# Patient Record
Sex: Female | Born: 1945 | Race: White | Hispanic: No | Marital: Married | Smoking: Former smoker
Health system: Southern US, Community
[De-identification: ages and names within clinical notes are randomized; demographics above are authoritative.]

## PROBLEM LIST (undated history)

## (undated) DIAGNOSIS — K219 Gastro-esophageal reflux disease without esophagitis: Secondary | ICD-10-CM

## (undated) DIAGNOSIS — I10 Essential (primary) hypertension: Secondary | ICD-10-CM

## (undated) HISTORY — PX: TONSILLECTOMY: SUR1361

## (undated) HISTORY — PX: ABDOMINAL HYSTERECTOMY: SHX81

## (undated) HISTORY — PX: CHOLECYSTECTOMY: SHX55

## (undated) HISTORY — PX: APPENDECTOMY: SHX54

## (undated) HISTORY — PX: EYE SURGERY: SHX253

---

## 2004-01-28 ENCOUNTER — Ambulatory Visit: Payer: Self-pay | Admitting: Internal Medicine

## 2004-08-07 ENCOUNTER — Ambulatory Visit: Payer: Self-pay

## 2005-01-22 ENCOUNTER — Ambulatory Visit: Payer: Self-pay | Admitting: Unknown Physician Specialty

## 2007-05-25 ENCOUNTER — Encounter: Payer: Self-pay | Admitting: Orthopaedic Surgery

## 2007-06-05 ENCOUNTER — Ambulatory Visit: Payer: Self-pay | Admitting: Orthopaedic Surgery

## 2007-06-21 ENCOUNTER — Encounter: Payer: Self-pay | Admitting: Orthopaedic Surgery

## 2007-07-05 ENCOUNTER — Ambulatory Visit: Payer: Self-pay | Admitting: Orthopaedic Surgery

## 2007-07-13 ENCOUNTER — Ambulatory Visit: Payer: Self-pay | Admitting: Orthopaedic Surgery

## 2007-07-26 ENCOUNTER — Encounter: Payer: Self-pay | Admitting: Orthopaedic Surgery

## 2007-08-21 ENCOUNTER — Encounter: Payer: Self-pay | Admitting: Orthopaedic Surgery

## 2007-09-20 ENCOUNTER — Encounter: Payer: Self-pay | Admitting: Orthopaedic Surgery

## 2007-10-21 ENCOUNTER — Encounter: Payer: Self-pay | Admitting: Orthopaedic Surgery

## 2007-11-21 ENCOUNTER — Encounter: Payer: Self-pay | Admitting: Orthopaedic Surgery

## 2009-11-25 ENCOUNTER — Ambulatory Visit: Payer: Self-pay | Admitting: Unknown Physician Specialty

## 2010-05-26 ENCOUNTER — Emergency Department: Payer: Self-pay | Admitting: Emergency Medicine

## 2013-07-30 DIAGNOSIS — I10 Essential (primary) hypertension: Secondary | ICD-10-CM | POA: Insufficient documentation

## 2013-07-30 DIAGNOSIS — E785 Hyperlipidemia, unspecified: Secondary | ICD-10-CM | POA: Insufficient documentation

## 2013-07-30 DIAGNOSIS — D649 Anemia, unspecified: Secondary | ICD-10-CM | POA: Insufficient documentation

## 2013-10-17 DIAGNOSIS — N393 Stress incontinence (female) (male): Secondary | ICD-10-CM | POA: Insufficient documentation

## 2014-12-10 ENCOUNTER — Ambulatory Visit: Payer: Medicare Other | Attending: Specialist

## 2014-12-10 DIAGNOSIS — G4761 Periodic limb movement disorder: Secondary | ICD-10-CM | POA: Diagnosis not present

## 2014-12-10 DIAGNOSIS — G4733 Obstructive sleep apnea (adult) (pediatric): Secondary | ICD-10-CM | POA: Diagnosis not present

## 2015-03-06 DIAGNOSIS — Z9841 Cataract extraction status, right eye: Secondary | ICD-10-CM | POA: Insufficient documentation

## 2015-05-28 ENCOUNTER — Other Ambulatory Visit: Payer: Self-pay | Admitting: Internal Medicine

## 2015-05-28 DIAGNOSIS — R1310 Dysphagia, unspecified: Secondary | ICD-10-CM

## 2015-06-05 ENCOUNTER — Ambulatory Visit
Admission: RE | Admit: 2015-06-05 | Discharge: 2015-06-05 | Disposition: A | Discharge status: Home / Self Care | Payer: Medicare HMO | Source: Ambulatory Visit | Attending: Internal Medicine | Admitting: Internal Medicine

## 2015-06-05 DIAGNOSIS — K449 Diaphragmatic hernia without obstruction or gangrene: Secondary | ICD-10-CM | POA: Diagnosis not present

## 2015-06-05 DIAGNOSIS — K219 Gastro-esophageal reflux disease without esophagitis: Secondary | ICD-10-CM | POA: Diagnosis not present

## 2015-06-05 DIAGNOSIS — K224 Dyskinesia of esophagus: Secondary | ICD-10-CM | POA: Diagnosis not present

## 2015-06-05 DIAGNOSIS — R1310 Dysphagia, unspecified: Secondary | ICD-10-CM | POA: Diagnosis present

## 2016-04-05 DIAGNOSIS — R0609 Other forms of dyspnea: Secondary | ICD-10-CM | POA: Insufficient documentation

## 2016-05-20 ENCOUNTER — Other Ambulatory Visit: Payer: Self-pay | Admitting: Internal Medicine

## 2016-05-20 DIAGNOSIS — R1030 Lower abdominal pain, unspecified: Secondary | ICD-10-CM

## 2016-05-31 ENCOUNTER — Ambulatory Visit
Admission: RE | Admit: 2016-05-31 | Discharge: 2016-05-31 | Disposition: A | Discharge status: Home / Self Care | Payer: Medicare HMO | Source: Ambulatory Visit | Attending: Internal Medicine | Admitting: Internal Medicine

## 2016-05-31 DIAGNOSIS — K5732 Diverticulitis of large intestine without perforation or abscess without bleeding: Secondary | ICD-10-CM | POA: Insufficient documentation

## 2016-05-31 DIAGNOSIS — D3502 Benign neoplasm of left adrenal gland: Secondary | ICD-10-CM | POA: Diagnosis not present

## 2016-05-31 DIAGNOSIS — R1030 Lower abdominal pain, unspecified: Secondary | ICD-10-CM | POA: Diagnosis present

## 2016-05-31 HISTORY — DX: Essential (primary) hypertension: I10

## 2016-05-31 LAB — POCT I-STAT CREATININE: Creatinine, Ser: 0.6 mg/dL (ref 0.44–1.00)

## 2016-05-31 MED ORDER — IOPAMIDOL (ISOVUE-300) INJECTION 61%
100.0000 mL | Freq: Once | INTRAVENOUS | Status: AC | PRN
  Administered 2016-05-31: 100 mL via INTRAVENOUS

## 2016-06-09 ENCOUNTER — Ambulatory Visit
Admission: RE | Admit: 2016-06-09 | Discharge: 2016-06-09 | Disposition: A | Discharge status: Home / Self Care | Payer: Medicare HMO | Source: Ambulatory Visit | Attending: Internal Medicine | Admitting: Internal Medicine

## 2016-06-09 ENCOUNTER — Other Ambulatory Visit: Payer: Self-pay | Admitting: Internal Medicine

## 2016-06-09 DIAGNOSIS — Z90710 Acquired absence of both cervix and uterus: Secondary | ICD-10-CM | POA: Insufficient documentation

## 2016-06-09 DIAGNOSIS — Z9049 Acquired absence of other specified parts of digestive tract: Secondary | ICD-10-CM | POA: Insufficient documentation

## 2016-06-09 DIAGNOSIS — R1084 Generalized abdominal pain: Secondary | ICD-10-CM

## 2016-06-09 DIAGNOSIS — K5730 Diverticulosis of large intestine without perforation or abscess without bleeding: Secondary | ICD-10-CM | POA: Insufficient documentation

## 2016-06-09 MED ORDER — IOPAMIDOL (ISOVUE-300) INJECTION 61%
100.0000 mL | Freq: Once | INTRAVENOUS | Status: AC | PRN
  Administered 2016-06-09: 100 mL via INTRAVENOUS

## 2016-09-14 DIAGNOSIS — K2100 Gastro-esophageal reflux disease with esophagitis, without bleeding: Secondary | ICD-10-CM | POA: Insufficient documentation

## 2018-04-12 IMAGING — CT CT ABD-PELV W/ CM
1 of 3 series · 12 of 32 positions shown, 17 images · IV contrast (APPLIED)
Comparison: None.

CLINICAL DATA: Pelvic region pain

EXAM:
CT ABDOMEN AND PELVIS WITH CONTRAST
TECHNIQUE: Multidetector CT imaging of the abdomen and pelvis was performed
using the standard protocol following bolus administration of
intravenous contrast. Oral contrast was also administered.
CONTRAST:  100mL Z21YDV-EMM IOPAMIDOL (Z21YDV-EMM) INJECTION 61%

[Series 2: axial st · axial · 0.69mm/px · z∈[-1038,-638]mm · 12 of 92 slices shown, 17 images]
[im 6/92  soft-tissue]
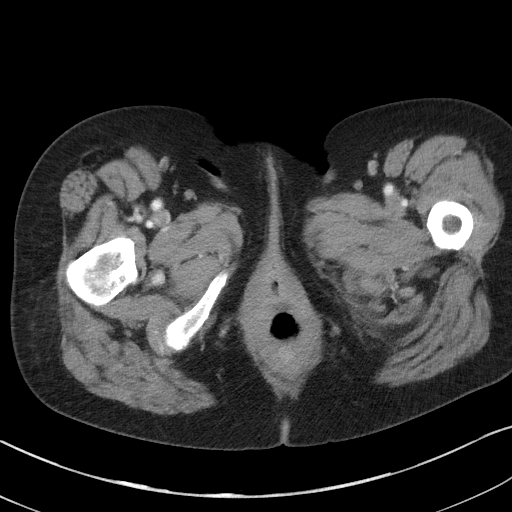
[im 6/92  bone]
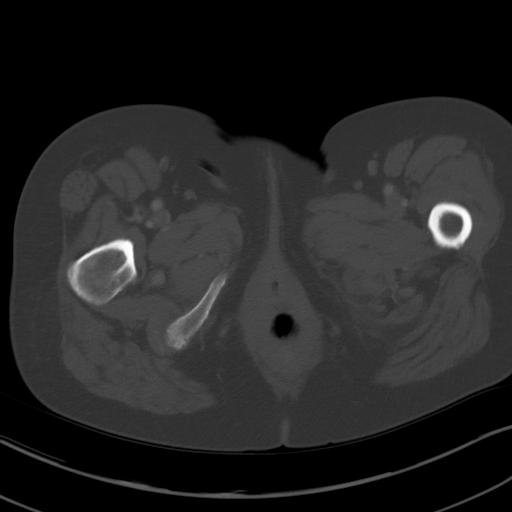
[im 16/92  soft-tissue]
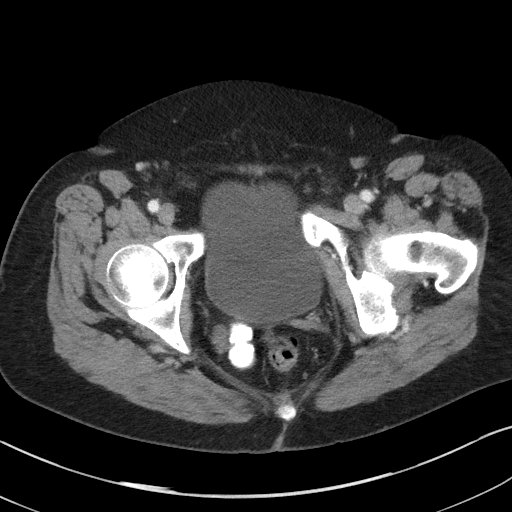
[im 21/92  soft-tissue]
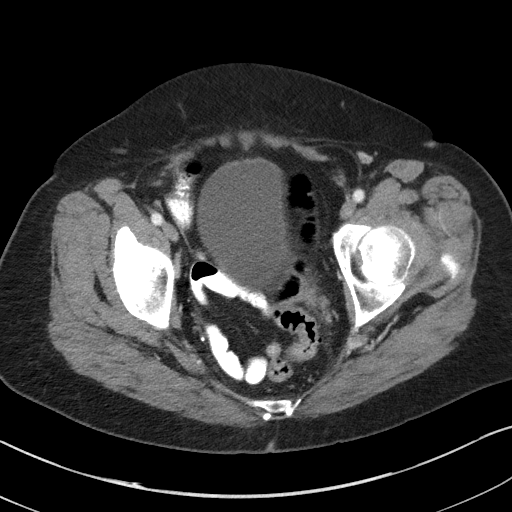
[im 31/92  soft-tissue]
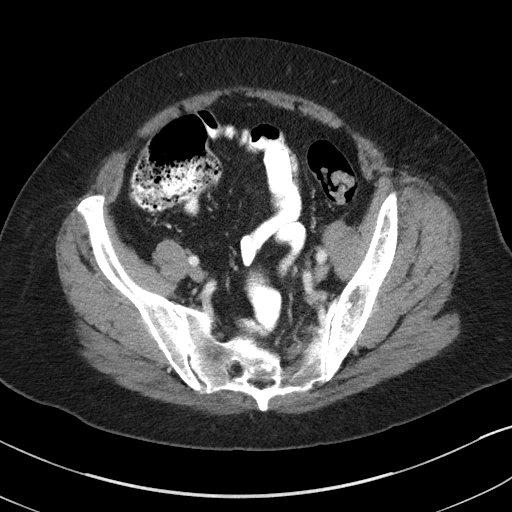
[im 36/92  soft-tissue]
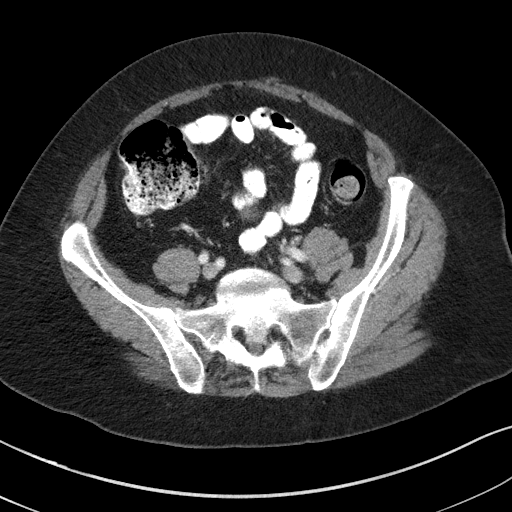
[im 46/92  soft-tissue]
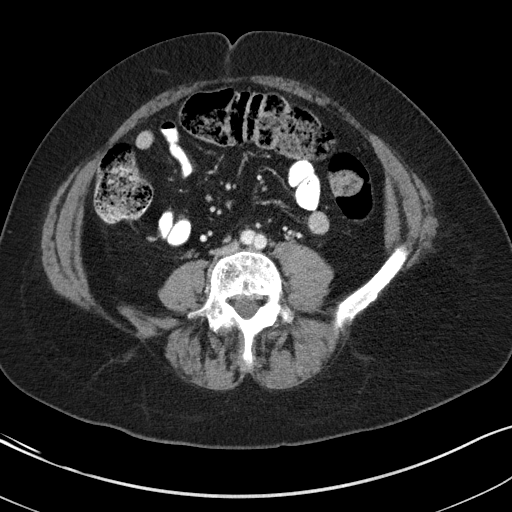
[im 56/92  soft-tissue]
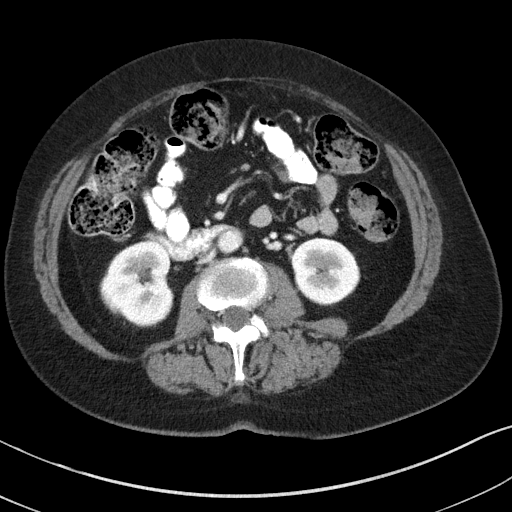
[im 61/92  soft-tissue]
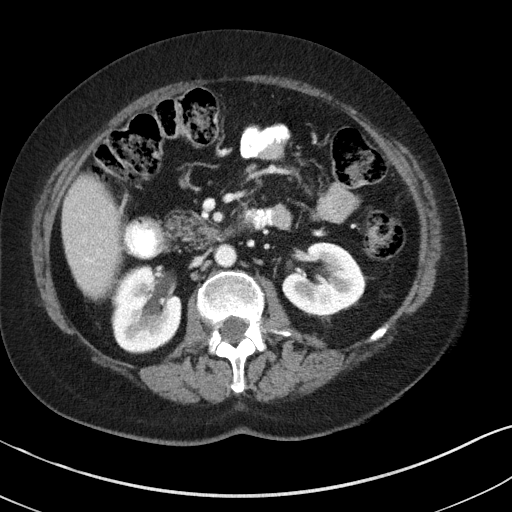
[im 71/92  soft-tissue]
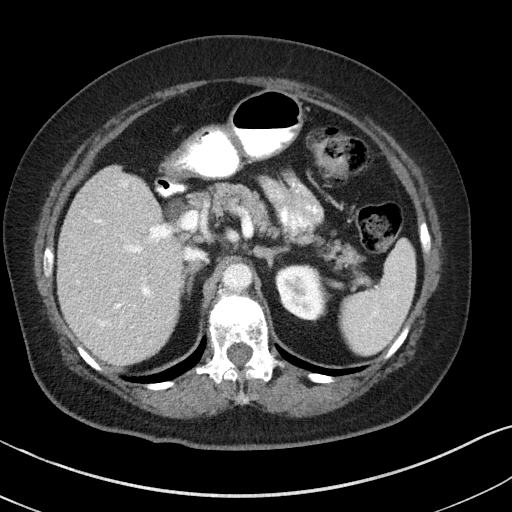
[im 71/92  lung]
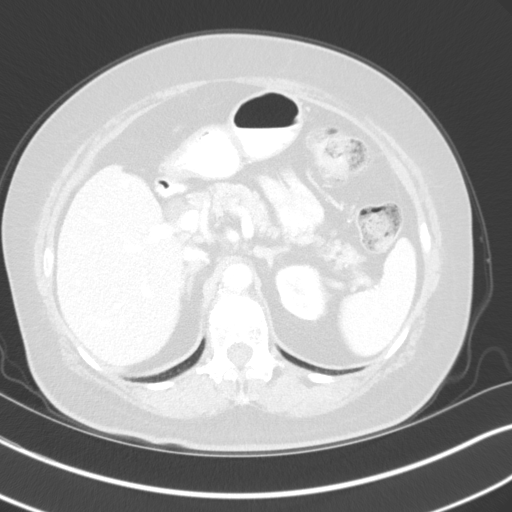
[im 71/92  bone]
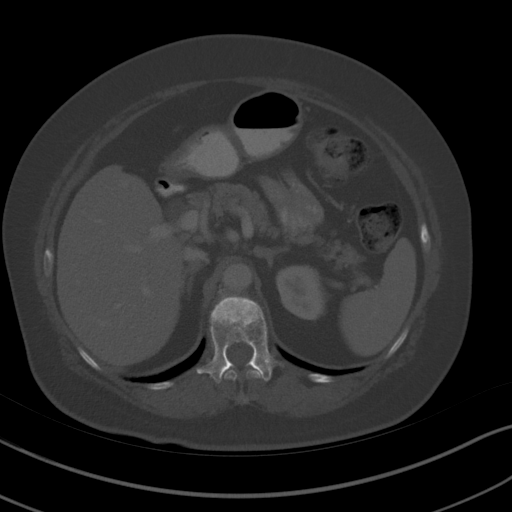
[im 76/92  soft-tissue]
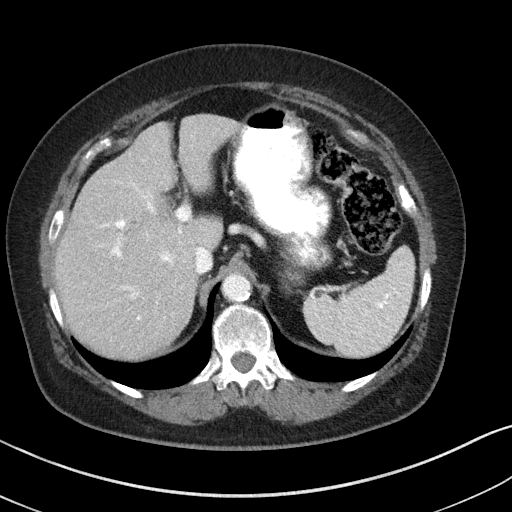
[im 76/92  lung]
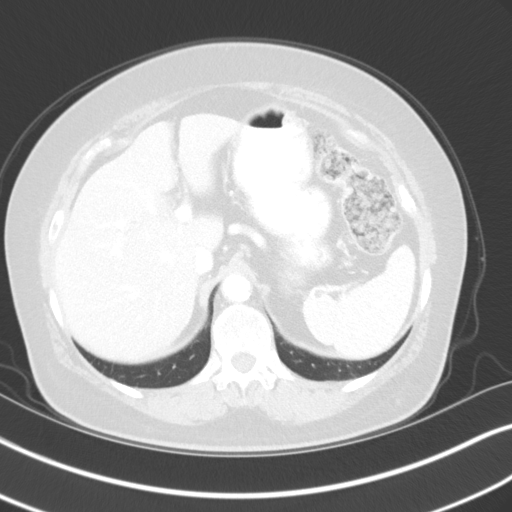
[im 81/92  lung]
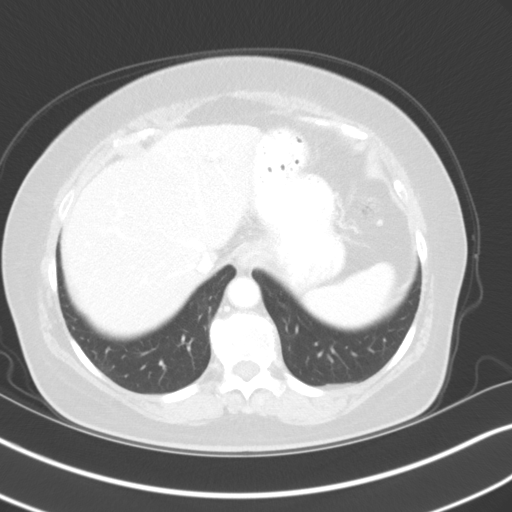
[im 86/92  soft-tissue]
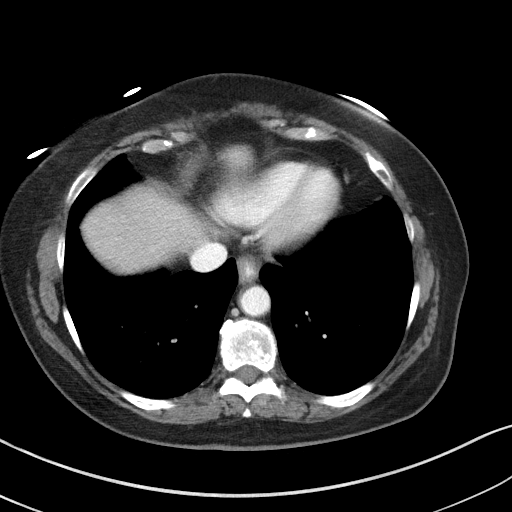
[im 86/92  lung]
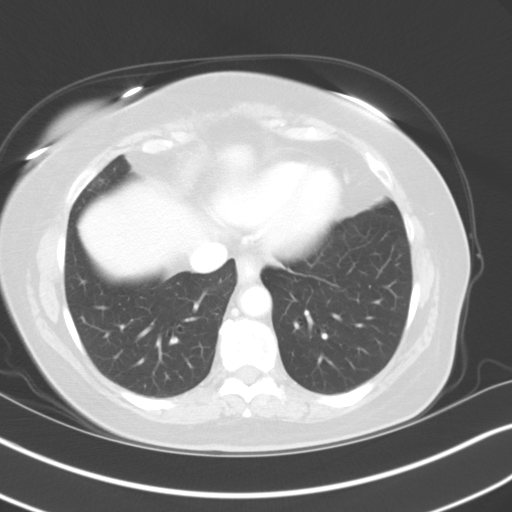

[12 of 32 positions shown; findings below may reference images not displayed]

FINDINGS: Lower chest: Lung bases are clear.

Hepatobiliary: There is hepatic steatosis. There is a 5 mm probable
cyst in the anterior segment of the right lobe of the liver
peripherally. No other focal liver lesion evident. Gallbladder is
absent. There is mild intrahepatic duct dilatation, probably due to
post cholecystectomy state. Common bile duct measures 9 mm, upper
normal for post cholecystectomy state. No biliary duct mass or
calculus evident.

Pancreas: There is mild inflammation at the level of the junction of
the second portion of the duodenum and head of the pancreas. No
other pancreatic inflammation seen. No pancreatic duct dilatation.
No mass evident.

Spleen: No splenic lesion evident.

Adrenals/Urinary Tract: Right adrenal appears normal. There is a
left adrenal apparent adenoma measuring 9 x 8 mm. Kidneys
bilaterally show no evident mass or hydronephrosis on either side.
There is no renal or ureteral calculus on either side. Urinary
bladder is midline with wall thickness within normal limits. There
is mild urinary bladder prolapse.

Stomach/Bowel: There is wall thickening in the distal sigmoid colon
with mild surrounding mesenteric thickening consistent with a degree
of distal sigmoid diverticulitis. There is no abscess or
perforation.

There is mild wall thickening in the second portion of the duodenum.
No fistula.

No other bowel wall thickening. No bowel obstruction. No free air or
portal venous air. There is lipomatous infiltration of the ileocecal
valve.

Vascular/Lymphatic: There is atherosclerotic calcification in the
aorta. There is no appreciable abdominal aortic aneurysm. Aorta is
mildly tortuous. There is no adenopathy in the abdomen or pelvis.

Reproductive: Uterus is absent. No pelvic mass or pelvic fluid
collection. Mild vaginal prolapse as well as urinary bladder
prolapse noted.

Other: There is no periappendiceal region inflammation. No abscess
or ascites in the abdomen or pelvis.

Musculoskeletal: There is degenerative change in the lumbar spine.
There is thoracolumbar dextroscoliosis. There are multiple small
sclerotic foci in the pelvis, all subcentimeter. No lytic or
destructive lesions are evident. No abdominal wall or intramuscular
lesion.
IMPRESSION: Distal sigmoid diverticulitis without abscess or perforation. No
other bowel wall thickening.

There is subtle inflammation in the region of the head of the
pancreas adjacent to the second portion of the duodenum. There is
mild wall thickening in the second portion the duodenum. Question
mild pancreatitis causing inflammation of the proximal duodenum
versus proximal duodenitis causing mild inflammation of the adjacent
pancreas. Appropriate laboratory correlation advised. This finding
may warrant nonemergent direct visualization of the duodenum to
further assess.

Several small sclerotic foci are noted in the pelvis. This
significance of this finding is uncertain. Small bone islands could
present in this manner. Small sclerotic metastases also could
present in this manner. As breast carcinoma is the most likely
neoplastic lesion to present in this manner, correlation with breast
examination and mammography advised to further assess in this
regard.

No bowel obstruction.  No abscess.  No appendiceal inflammation.

No renal or ureteral calculus.  No hydronephrosis.

Small left adrenal adenoma.

These results will be called to the ordering clinician or
representative by the Radiologist Assistant, and communication
documented in the PACS or zVision Dashboard.

## 2018-10-13 DIAGNOSIS — E559 Vitamin D deficiency, unspecified: Secondary | ICD-10-CM | POA: Insufficient documentation

## 2019-05-30 ENCOUNTER — Emergency Department
Admission: EM | Admit: 2019-05-30 | Discharge: 2019-05-30 | Disposition: A | Discharge status: Home / Self Care | Payer: Medicare HMO | Attending: Emergency Medicine | Admitting: Emergency Medicine

## 2019-05-30 ENCOUNTER — Other Ambulatory Visit: Payer: Self-pay

## 2019-05-30 ENCOUNTER — Emergency Department: Payer: Medicare HMO

## 2019-05-30 DIAGNOSIS — I10 Essential (primary) hypertension: Secondary | ICD-10-CM | POA: Diagnosis not present

## 2019-05-30 DIAGNOSIS — R079 Chest pain, unspecified: Secondary | ICD-10-CM | POA: Diagnosis present

## 2019-05-30 DIAGNOSIS — R0789 Other chest pain: Secondary | ICD-10-CM | POA: Insufficient documentation

## 2019-05-30 LAB — COMPREHENSIVE METABOLIC PANEL
ALT: 57 U/L — ABNORMAL HIGH (ref 0–44)
AST: 48 U/L — ABNORMAL HIGH (ref 15–41)
Albumin: 3.9 g/dL (ref 3.5–5.0)
Alkaline Phosphatase: 61 U/L (ref 38–126)
Anion gap: 8 (ref 5–15)
BUN: 14 mg/dL (ref 8–23)
CO2: 29 mmol/L (ref 22–32)
Calcium: 9.1 mg/dL (ref 8.9–10.3)
Chloride: 101 mmol/L (ref 98–111)
Creatinine, Ser: 0.65 mg/dL (ref 0.44–1.00)
GFR calc Af Amer: >60 mL/min (ref >60)
GFR calc non Af Amer: >60 mL/min (ref >60)
Glucose, Bld: 104 mg/dL — ABNORMAL HIGH (ref 70–99)
Potassium: 3.7 mmol/L (ref 3.5–5.1)
Sodium: 138 mmol/L (ref 135–145)
Total Bilirubin: 0.9 mg/dL (ref 0.3–1.2)
Total Protein: 7.2 g/dL (ref 6.5–8.1)

## 2019-05-30 LAB — CBC
HCT: 41.1 % (ref 36.0–46.0)
Hemoglobin: 13.9 g/dL (ref 12.0–15.0)
MCH: 29.6 pg (ref 26.0–34.0)
MCHC: 33.8 g/dL (ref 30.0–36.0)
MCV: 87.6 fL (ref 80.0–100.0)
Platelets: 193 10*3/uL (ref 150–400)
RBC: 4.69 MIL/uL (ref 3.87–5.11)
RDW: 12.1 % (ref 11.5–15.5)
WBC: 4 10*3/uL (ref 4.0–10.5)
nRBC: 0 % (ref 0.0–0.2)

## 2019-05-30 LAB — LIPASE, BLOOD: Lipase: 26 U/L (ref 11–51)

## 2019-05-30 LAB — TROPONIN I (HIGH SENSITIVITY)
Troponin I (High Sensitivity): 4 ng/L (ref <18)
Troponin I (High Sensitivity): 3 ng/L (ref <18)

## 2019-05-30 NOTE — ED Triage Notes (Signed)
Pt in with co chest pain since yesterday, states to upper chest radiates to upper back. Describes it as tightening, states feels like when she had gallstones but has had gallbladder removed. Pt denies any shob, diaphoresis, or cold symptoms.

## 2019-05-30 NOTE — ED Notes (Addendum)
Pt states having pain in her chest and around her sternum earlier today, but that it has relieved at this point. Pt denies shob, nausea, fevers, or other symptoms at this time. Pt describes pain as being similar to previous issues with indigestion/gall bladder problems.

## 2019-05-30 NOTE — ED Provider Notes (Signed)
[facility] Emergency Department Provider Note   ____________________________________________    I have reviewed the triage vital signs and the nursing notes.   HISTORY  Chief Complaint Chest Pain     HPI MEE CHERNEY is a 74 y.o. female who presents with complaints of cramping in her chest and back.  Patient reports this occurred around 4:30 PM.  She notes that she had the second coronavirus vaccine on Monday, felt fatigued ill and had body aches yesterday with chills.  Today was mostly feeling better but had cramping in her mid to upper back.  No shortness of breath.  No cough.  Is feeling improved now and symptoms have resolved.  Did not take anything for this.  Does have a history of hypertension.  No history of CAD  Past Medical History:  Diagnosis Date  . Hypertension     There are no problems to display for this patient.     Prior to Admission medications   Not on File     Allergies Sulfa antibiotics  No family history on file.  Social History Patient does not drink, does not smoke Review of Systems  Constitutional: No fever/chills Eyes: No visual changes.  ENT: No sore throat. Cardiovascular: As above Respiratory: Denies shortness of breath. Gastrointestinal: No abdominal pain.  No nausea, no vomiting.   Genitourinary: Negative for dysuria. Musculoskeletal: As above Skin: Negative for rash. Neurological: Negative for headaches or weakness   ____________________________________________   PHYSICAL EXAM:  VITAL SIGNS: ED Triage Vitals  Enc Vitals Group     BP 05/30/19 1923 138/62     Pulse Rate 05/30/19 1923 92     Resp 05/30/19 1923 20     Temp 05/30/19 1923 98.8 F (37.1 C)     Temp Source 05/30/19 1923 Oral     SpO2 05/30/19 1923 97 %     Weight 05/30/19 1924 72.6 kg (160 lb)     Height 05/30/19 1924 1.575 m (5\' 2" )     Head Circumference --      Peak Flow --      Pain Score 05/30/19 1924 8     Pain  Loc --      Pain Edu? --      Excl. in [place]? --     Constitutional: Alert and oriented. No acute distress. Pleasant and interactive  Nose: No congestion/rhinnorhea. Mouth/Throat: Mucous membranes are moist.    Cardiovascular: Normal rate, regular rhythm.  No chest wall tenderness palpation, good peripheral circulation. Respiratory: Normal respiratory effort.  No retractions. Lungs CTAB. Gastrointestinal: Soft and nontender. No distention.    Musculoskeletal: No rash, no vertebral tenderness palpation.  Warm and well perfused Neurologic:  Normal speech and language. No gross focal neurologic deficits are appreciated.  Skin:  Skin is warm, dry and intact. No rash noted. Psychiatric: Mood and affect are normal. Speech and behavior are normal.  ____________________________________________   LABS (all labs ordered are listed, but only abnormal results are displayed)  Labs Reviewed  COMPREHENSIVE METABOLIC PANEL - Abnormal; Notable for the following components:      Result Value   Glucose, Bld 104 (*)    AST 48 (*)    ALT 57 (*)    All other components within normal limits  CBC  LIPASE, BLOOD  TROPONIN I (HIGH SENSITIVITY)  TROPONIN I (HIGH SENSITIVITY)   ____________________________________________  EKG  ED ECG REPORT I, Lavonia Drafts, the attending physician, personally viewed and interpreted this ECG.  Date: 05/30/2019  Rhythm: normal sinus rhythm QRS Axis: normal Intervals: normal ST/T Wave abnormalities: normal Narrative Interpretation: no evidence of acute ischemia  ____________________________________________  RADIOLOGY  Chest x-ray viewed by me, clear lungs, no pneumonia, no pneumothorax ____________________________________________   PROCEDURES  Procedure(s) performed: No  Procedures   Critical Care performed: No ____________________________________________   INITIAL IMPRESSION / ASSESSMENT AND PLAN / ED COURSE  Pertinent labs & imaging results  that were available during my care of the patient were reviewed by me and considered in my medical decision making (see chart for details).  Patient presents with cramping primarily in her mid to upper back, resolved while in the waiting room.  Vital signs here are normal.  No shortness of breath.  No anterior chest pain.  No pleurisy.  Not consistent with PE or dissection.  EKG is quite reassuring, initial troponin is normal making this unlikely to be ACS.  Will check second troponin if normal anticipate discharge with outpatient follow-up.  Second troponin normal.  CBC and CMP are overall quite reassuring,    ____________________________________________   FINAL CLINICAL IMPRESSION(S) / ED DIAGNOSES  Final diagnoses:  Atypical chest pain        Note:  This document was prepared using Dragon voice recognition software and may include unintentional dictation errors.   Lavonia Drafts, MD 05/30/19 2241

## 2021-04-10 IMAGING — CR DG CHEST 2V
1 series · 2 of 2 positions shown · non-contrast
Comparison: 05/26/2010

CLINICAL DATA: Chest pain for 2 days, initial encounter

EXAM:
CHEST - 2 VIEW

[Series 1: dg chest 2 view · 0.14mm/px · 2 of 2 slices shown]
[im 1/2]
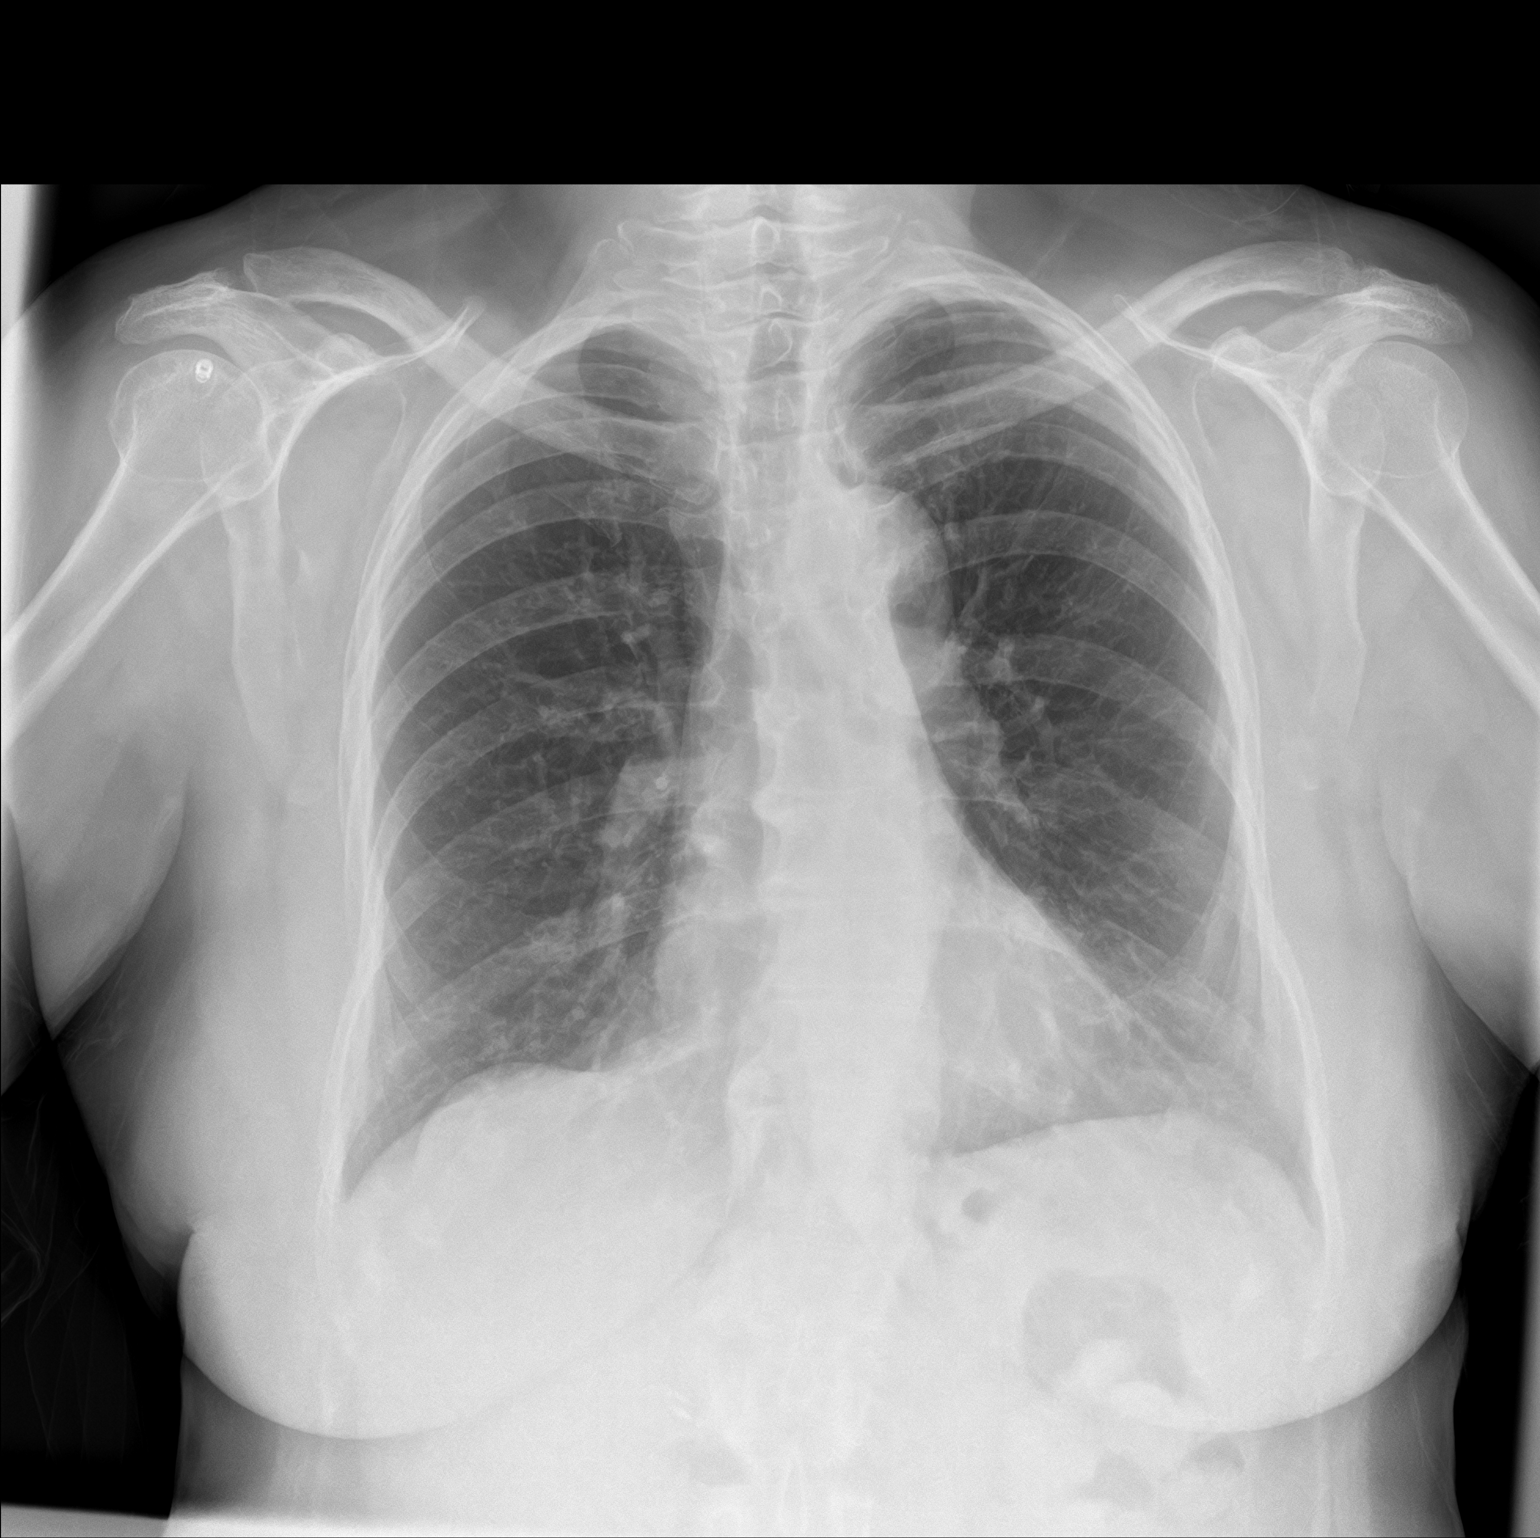
[im 2/2]
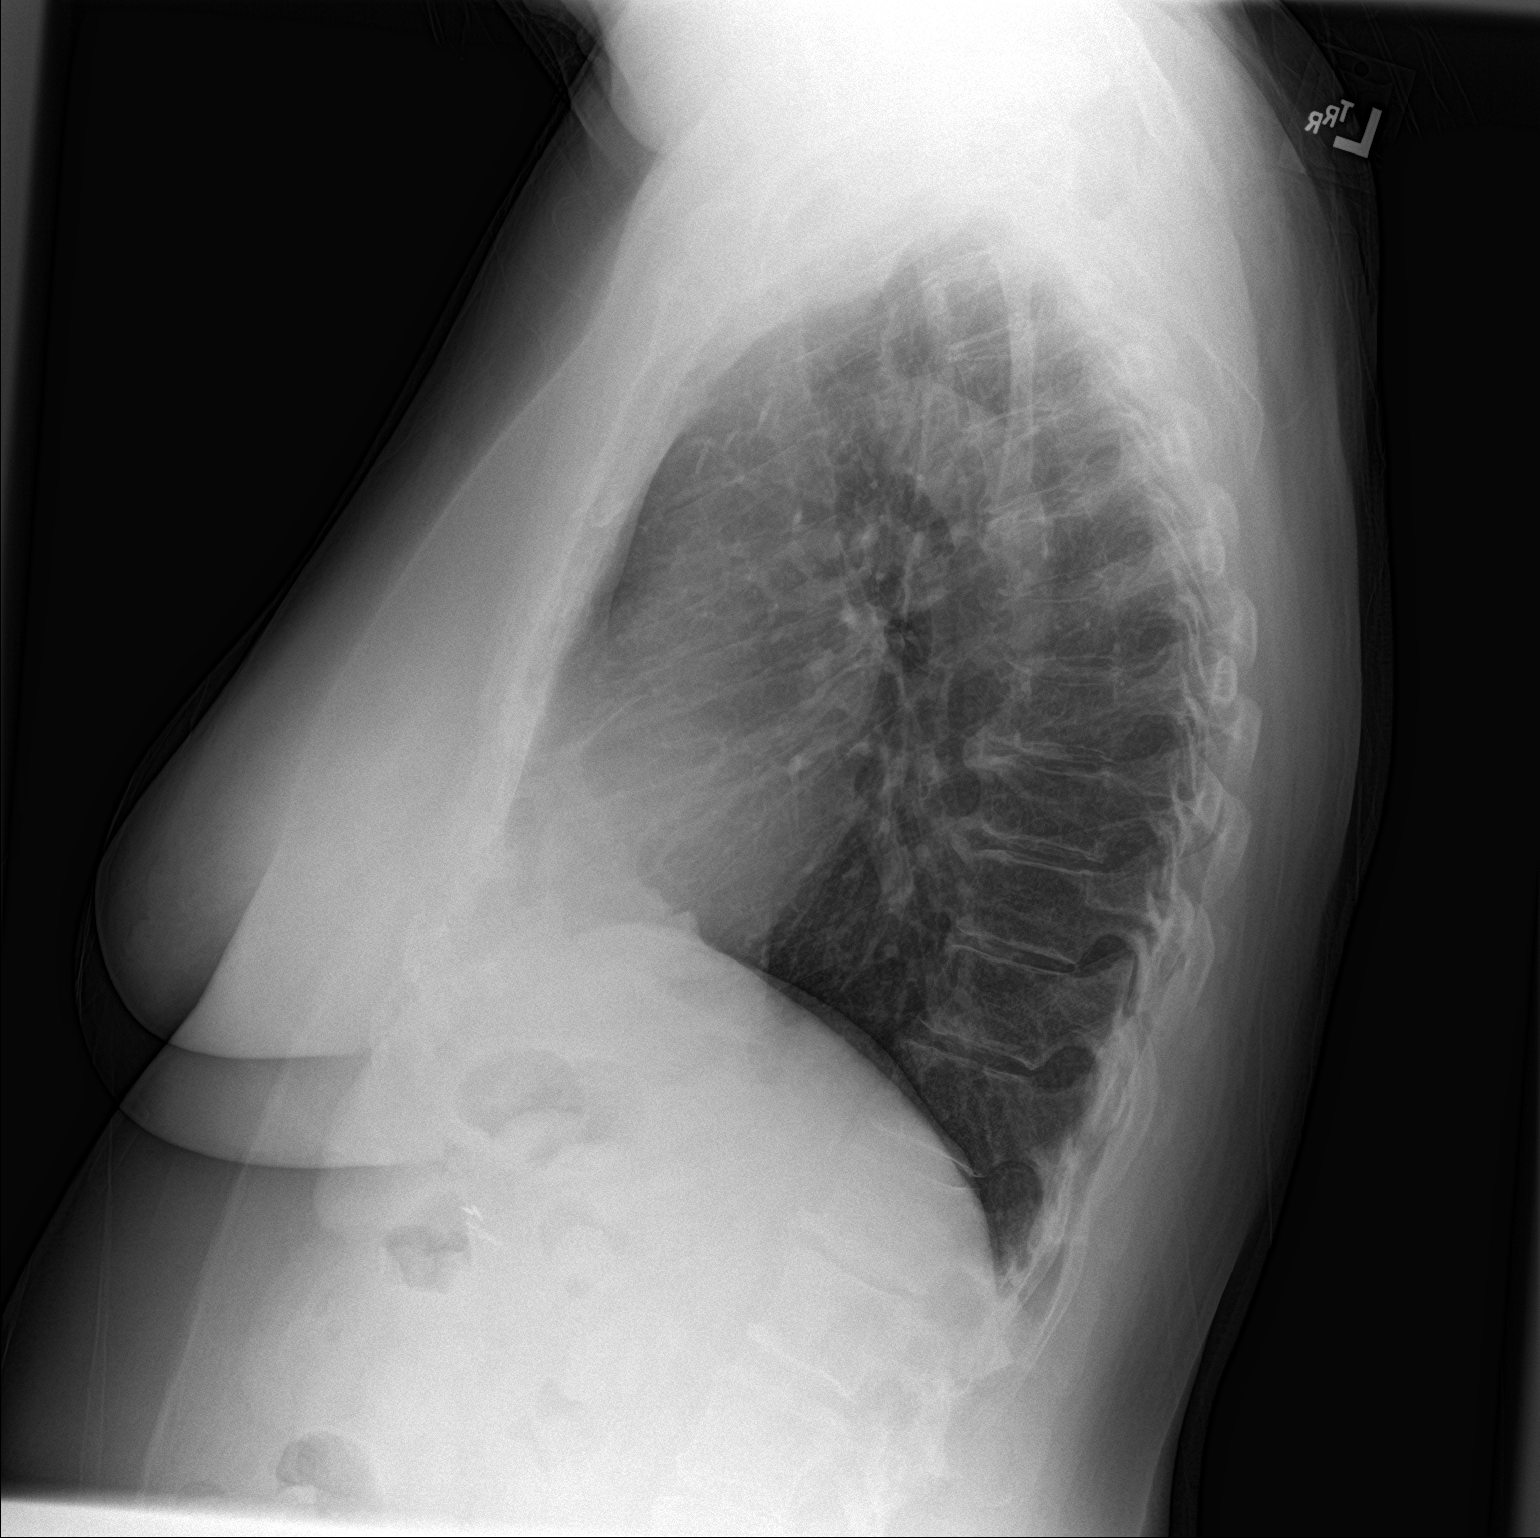

[2 of 2 positions shown; findings below may reference images not displayed]

FINDINGS: Cardiac shadow is within normal limits. Aortic calcifications are
seen. The lungs are well aerated bilaterally. No focal infiltrate or
sizable effusion is seen. No bony abnormality is noted.
IMPRESSION: No active cardiopulmonary disease.

## 2021-05-20 ENCOUNTER — Inpatient Hospital Stay
Admission: RE | Admit: 2021-05-20 | Discharge: 2021-05-20 | Disposition: A | Discharge status: Home / Self Care | Payer: Self-pay | Source: Ambulatory Visit | Attending: *Deleted | Admitting: *Deleted

## 2021-05-20 ENCOUNTER — Other Ambulatory Visit: Payer: Self-pay | Admitting: *Deleted

## 2021-05-20 DIAGNOSIS — Z1231 Encounter for screening mammogram for malignant neoplasm of breast: Secondary | ICD-10-CM

## 2021-05-22 ENCOUNTER — Other Ambulatory Visit: Payer: Self-pay | Admitting: Internal Medicine

## 2021-05-22 DIAGNOSIS — N6321 Unspecified lump in the left breast, upper outer quadrant: Secondary | ICD-10-CM

## 2021-06-09 NOTE — Addendum Note (Signed)
Encounter addended by: Solon Augusta on: 06/09/2021 1:22 PM ? Actions taken: Imaging Exam ended

## 2021-06-09 NOTE — Addendum Note (Signed)
Encounter addended by: Solon Augusta on: 06/09/2021 1:23 PM ? Actions taken: Imaging Exam ended

## 2021-06-09 NOTE — Addendum Note (Signed)
Encounter addended by: Solon Augusta on: 06/09/2021 1:24 PM ? Actions taken: Imaging Exam ended

## 2022-02-10 ENCOUNTER — Other Ambulatory Visit: Payer: Self-pay

## 2022-02-10 DIAGNOSIS — Z780 Asymptomatic menopausal state: Secondary | ICD-10-CM | POA: Insufficient documentation

## 2022-02-10 DIAGNOSIS — M5136 Other intervertebral disc degeneration, lumbar region: Secondary | ICD-10-CM | POA: Insufficient documentation

## 2022-02-10 DIAGNOSIS — M51369 Other intervertebral disc degeneration, lumbar region without mention of lumbar back pain or lower extremity pain: Secondary | ICD-10-CM | POA: Insufficient documentation

## 2022-02-10 DIAGNOSIS — K269 Duodenal ulcer, unspecified as acute or chronic, without hemorrhage or perforation: Secondary | ICD-10-CM | POA: Insufficient documentation

## 2022-02-15 ENCOUNTER — Ambulatory Visit: Payer: Medicare HMO | Admitting: Gastroenterology

## 2022-02-15 VITALS — BP 135/81 | HR 82 | Temp 98.1°F | Ht 62.5 in | Wt 171.8 lb

## 2022-02-15 DIAGNOSIS — K219 Gastro-esophageal reflux disease without esophagitis: Secondary | ICD-10-CM | POA: Diagnosis not present

## 2022-02-15 DIAGNOSIS — R152 Fecal urgency: Secondary | ICD-10-CM

## 2022-02-15 DIAGNOSIS — R159 Full incontinence of feces: Secondary | ICD-10-CM | POA: Diagnosis not present

## 2022-02-15 MED ORDER — OMEPRAZOLE 20 MG PO CPDR: 20.0000 mg | DELAYED_RELEASE_CAPSULE | Freq: Two times a day (BID) | ORAL | 0 refills | Status: DC

## 2022-02-15 NOTE — Progress Notes (Signed)
Jonathon Bellows MD, MRCP(U.K) [address]  [place]  [place], [place] [address]  Main: [phone]  Fax: [phone]   Gastroenterology Consultation  Referring Provider:     Idelle Crouch, MD Primary Care Physician:  Idelle Crouch, MD Primary Gastroenterologist:  Dr. Jonathon Bellows  Reason for Consultation:   Indigestion        HPI:   Gina Arnold is a 76 y.o. y/o female referred for consultation & management  by Dr. Doy Hutching, Leonie Douglas, MD.     She says she is here today to see me for symptoms of indigestion.  She says that has been going on for a few months.  She said that her symptoms are predominantly heartburn which she wakes up in the morning no issues with swallowing she has a dinner an hour or 2 before bedtime but lays flat right after her dinner not on any PPI.  She has gained weight recently.  No other complaints.  Denies any abdominal pain. She also complains of recurrent UTIs and urinary and bladder incontinence going on for a while.  She recollects she was in prolonged labor during childbirth.  Her last colonoscopy was just a few years back and states she is not due for one yet. Past Medical History:  Diagnosis Date   Hypertension       Prior to Admission medications   Medication Sig Start Date End Date Taking? Authorizing Provider  amLODipine (NORVASC) 5 MG tablet Take 5 mg by mouth daily.   Yes [provider]  b complex vitamins capsule Take 1 capsule by mouth daily.   Yes [provider]  Calcium Carbonate 500 MG CHEW Chew by mouth.   Yes [provider]  famotidine (PEPCID) 40 MG tablet Take 40 mg by mouth daily.   Yes [provider]  magnesium oxide (MAG-OX) 400 MG tablet Take 400 mg by mouth daily.   Yes [provider]  melatonin 1 MG TABS tablet Take 1 mg by mouth at bedtime as needed.   Yes [provider]  oxybutynin (DITROPAN) 5 MG tablet Take 5 mg by mouth 3 (three) times daily. 08/27/20   Yes [provider]  pantoprazole (PROTONIX) 40 MG tablet Take 40 mg by mouth 2 (two) times daily. 08/04/21 08/04/22 Yes [provider]  phentermine (ADIPEX-P) 37.5 MG tablet Take 37.5 mg by mouth daily before breakfast. 09/23/21  Yes [provider]  simvastatin (ZOCOR) 20 MG tablet Take 1 tablet by mouth at bedtime. 03/24/21  Yes [provider]  UNABLE TO FIND Take by mouth.   Yes [provider]    No family history on file.      Allergies as of 02/15/2022 - Review Complete 02/15/2022  Allergen Reaction Noted   Hydrocodone-acetaminophen Itching and Other (See Comments) 10/16/2013   Sulfasalazine Nausea And Vomiting 05/31/2016   Ciprofloxacin Nausea And Vomiting and Nausea Only 08/02/2016   Codeine Itching 11/10/2013   Metronidazole Nausea And Vomiting and Nausea Only 08/02/2016   Sulfa antibiotics Other (See Comments) 02/17/2015   Tramadol Nausea And Vomiting 02/17/2015    Review of Systems:    All systems reviewed and negative except where noted in HPI.   Physical Exam:  BP 135/81   Pulse 82   Temp 98.1 F (36.7 C) (Oral)   Ht 5' 2.5" (1.588 m)   Wt 171 lb 12.8 oz (77.9 kg)   BMI 30.92 kg/m  No LMP recorded. Patient is postmenopausal.  Psych:  Alert and cooperative. Normal mood and affect. General:   Alert,  Well-developed, well-nourished, pleasant and cooperative in NAD Head:  Normocephalic and atraumatic. Eyes:  Sclera clear, no icterus.   Conjunctiva pink. Ears:  Normal auditory acuity. Neurologic:  Alert and oriented x3;  grossly normal neurologically. Psych:  Alert and cooperative. Normal mood and affect.  Imaging Studies: No results found.  Assessment and Plan:   Gina Arnold is a 76 y.o. y/o female has been referred for complaints of indigestion but her symptoms are very suggestive of nocturnal reflux.  Not yet attempted a trial of PPI she also had symptoms of fecal and urinary incontinence likely due to bladder  prolapse based on her history.  Plan 1.  Commence on omeprazole 20 mg twice daily at next visit if no better will proceed with EGD  2.  Bowel incontinence likely related to bladder prolapse suggested a colonoscopy but she would like to wait till her next visit and then if we are performing an EGD we will do the colonoscopy at the same time.  3.  Patient information provided about GERD picture of a wedge pillow provided discussed about lifestyle changes and need to lose some weight  Follow up in 8 weeks  Dr Jonathon Bellows MD,MRCP(U.K)

## 2022-02-15 NOTE — Patient Instructions (Addendum)
Please purchase a wedge pillow from [employer].  Food Choices for Gastroesophageal Reflux Disease, Adult When you have gastroesophageal reflux disease (GERD), the foods you eat and your eating habits are very important. Choosing the right foods can help ease your discomfort. Think about working with a food expert (dietitian) to help you make good choices. What are tips for following this plan? Reading food labels Look for foods that are low in saturated fat. Foods that may help with your symptoms include: Foods that have less than 5% of daily value (DV) of fat. Foods that have 0 grams of trans fat. Cooking Do not fry your food. Cook your food by baking, steaming, grilling, or broiling. These are all methods that do not need a lot of fat for cooking. To add flavor, try to use herbs that are low in spice and acidity. Meal planning  Choose healthy foods that are low in fat, such as: Fruits and vegetables. Whole grains. Low-fat dairy products. Lean meats, fish, and poultry. Eat small meals often instead of eating 3 large meals each day. Eat your meals slowly in a place where you are relaxed. Avoid bending over or lying down until 2-3 hours after eating. Limit high-fat foods such as fatty meats or fried foods. Limit your intake of fatty foods, such as oils, butter, and shortening. Avoid the following as told by your doctor: Foods that cause symptoms. These may be different for different people. Keep a food diary to keep track of foods that cause symptoms. Alcohol. Drinking a lot of liquid with meals. Eating meals during the 2-3 hours before bed. Lifestyle Stay at a healthy weight. Ask your doctor what weight is healthy for you. If you need to lose weight, work with your doctor to do so safely. Exercise for at least 30 minutes on 5 or more days each week, or as told by your doctor. Wear loose-fitting clothes. Do not smoke or use any products that contain nicotine or tobacco. If you need help  quitting, ask your doctor. Sleep with the head of your bed higher than your feet. Use a wedge under the mattress or blocks under the bed frame to raise the head of the bed. Chew sugar-free gum after meals. What foods should eat?  Eat a healthy, well-balanced diet of fruits, vegetables, whole grains, low-fat dairy products, lean meats, fish, and poultry. Each person is different. Foods that may cause symptoms in one person may not cause any symptoms in another person. Work with your doctor to find foods that are safe for you. The items listed above may not be a complete list of what you can eat and drink. Contact a food expert for more options. What foods should I avoid? Limiting some of these foods may help in managing the symptoms of GERD. Everyone is different. Talk with a food expert or your doctor to help you find the exact foods to avoid, if any. Fruits Any fruits prepared with added fat. Any fruits that cause symptoms. For some people, this may include citrus fruits, such as oranges, grapefruit, pineapple, and lemons. Vegetables Deep-fried vegetables. [place] fries. Any vegetables prepared with added fat. Any vegetables that cause symptoms. For some people, this may include tomatoes and tomato products, chili peppers, onions and garlic, and horseradish. Grains Pastries or quick breads with added fat. Meats and other proteins High-fat meats, such as fatty beef or pork, hot dogs, ribs, ham, sausage, salami, and bacon. Fried meat or protein, including fried fish and fried chicken. Nuts  and nut butters, in large amounts. Dairy Whole milk and chocolate milk. Sour cream. Cream. Ice cream. Cream cheese. Milkshakes. Fats and oils Butter. Margarine. Shortening. Ghee. Beverages Coffee and tea, with or without caffeine. Carbonated beverages. Sodas. Energy drinks. Fruit juice made with acidic fruits, such as orange or grapefruit. Tomato juice. Alcoholic drinks. Sweets and desserts Chocolate and  cocoa. Donuts. Seasonings and condiments Pepper. Peppermint and spearmint. Added salt. Any condiments, herbs, or seasonings that cause symptoms. For some people, this may include curry, hot sauce, or vinegar-based salad dressings. The items listed above may not be a complete list of what you should not eat and drink. Contact a food expert for more options. Questions to ask your doctor Diet and lifestyle changes are often the first steps that are taken to manage symptoms of GERD. If diet and lifestyle changes do not help, talk with your doctor about taking medicines. Where to find more information International [employer] for Gastrointestinal Disorders: aboutgerd.org Summary When you have GERD, food and lifestyle choices are very important in easing your symptoms. Eat small meals often instead of 3 large meals a day. Eat your meals slowly and in a place where you are relaxed. Avoid bending over or lying down until 2-3 hours after eating. Limit high-fat foods such as fatty meats or fried foods. This information is not intended to replace advice given to you by your health care provider. Make sure you discuss any questions you have with your health care provider. Document Revised: 09/17/2019 Document Reviewed: 09/17/2019 Elsevier Patient Education  [employer].

## 2022-03-05 ENCOUNTER — Ambulatory Visit: Payer: Medicare HMO | Admitting: Urology

## 2022-03-05 VITALS — BP 168/71 | HR 93 | Ht 62.0 in | Wt 170.0 lb

## 2022-03-05 DIAGNOSIS — Z87440 Personal history of urinary (tract) infections: Secondary | ICD-10-CM

## 2022-03-05 DIAGNOSIS — R399 Unspecified symptoms and signs involving the genitourinary system: Secondary | ICD-10-CM

## 2022-03-05 DIAGNOSIS — N390 Urinary tract infection, site not specified: Secondary | ICD-10-CM

## 2022-03-05 LAB — URINALYSIS, COMPLETE
Bilirubin, UA: NEGATIVE
Glucose, UA: NEGATIVE
Ketones, UA: NEGATIVE
Nitrite, UA: NEGATIVE
Protein,UA: NEGATIVE
RBC, UA: NEGATIVE
Specific Gravity, UA: 1.02 (ref 1.005–1.030)
Urobilinogen, Ur: 0.2 mg/dL (ref 0.2–1.0)
pH, UA: 6 (ref 5.0–7.5)

## 2022-03-05 LAB — MICROSCOPIC EXAMINATION

## 2022-03-05 LAB — BLADDER SCAN AMB NON-IMAGING: Scan Result: 0

## 2022-03-05 MED ORDER — ESTRADIOL 0.1 MG/GM VA CREA: TOPICAL_CREAM | VAGINAL | 0 refills | Status: DC

## 2022-03-05 MED ORDER — TRIMETHOPRIM 100 MG PO TABS: 100.0000 mg | ORAL_TABLET | Freq: Every day | ORAL | 2 refills | Status: DC

## 2022-03-05 NOTE — Progress Notes (Unsigned)
03/05/2022 10:47 AM   Gina Arnold 1945-07-26 [id number]  Referring provider: Idelle Crouch, MD [facility] [place],  [place] [address]  Chief Complaint  Patient presents with   Other    HPI: Gina Arnold is a 76 y.o. female.  For evaluation of her recurrent UTIs.  She estimates that she has been treated for 3-4 UTIs over the last year Chart review remarkable for positive urine cultures for Citrobacter and E. coli on 2 occasions Her typical symptoms are low back pain, suprapubic pressure, frequency, urgency, malodorous urine and occasional dysuria No febrile UTIs or pyelonephritis Symptoms resolve after antibiotic therapy then recur She is not sexually active Has been on IR oxybutynin for several years for overactive bladder which she takes twice daily Denies gross hematuria Past imaging remarkable for CT abdomen/pelvis in 2018 which showed no GU abnormalities She is just completing an antibiotic course and is asymptomatic at this time   PMH: Past Medical History:  Diagnosis Date   Hypertension   ADD (attention deficit disorder)  Anemia  DDD (degenerative disc disease)  chronic back pain  Duodenal ulcer  GERD (gastroesophageal reflux disease)  Hyperlipidemia   Surgical History: EXTRACTION CATARACT EXTRACAPSULAR W/INSERTION INTRAOCULAR PROSTHESIS Right 03/06/2015  Procedure: EXTRACAPSULAR CATARACT PHACO REMOVAL WITH INSERTION OF INTRAOCULAR LENS PROSTHESIS RIGHT EYE; Surgeon: Christean Grief, MD; Location: [facility]; Service: Ophthalmology; Laterality: Right;  PARTIAL EXCISION LIP Right 09/03/2016  Procedure: EXCISION OF LIP; V-EXCISION WITH PRIMARY DIRECT LINEAR CLOSURE; Surgeon: Richrd Prime, MD; Location[facility]; Service: Plastic Surgery; Laterality: Right;  LENS EYE SURGERY Left 10/12/2016  cataract surgery to left eye  BREAST BIOPSY Left  CHOLECYSTECTOMY  Right ankle hemangioma removal Shoulder surgeery  TAH/BSO    Home Medications:  Allergies as of 03/05/2022       Reactions   Hydrocodone-acetaminophen Itching, Other (See Comments)   unsure   Sulfasalazine Nausea And Vomiting   Decreases WBC   Ciprofloxacin Nausea And Vomiting, Nausea Only   Exhaustion and indigestion   Codeine Itching   Metronidazole Nausea And Vomiting, Nausea Only   Exhaustion and indigestion   Sulfa Antibiotics Other (See Comments)   Decreases WBC Decreased WBC Decreased wbcs   Tramadol Nausea And Vomiting   Other Reaction(s): Other (See Comments) GI Upset        Medication List        Accurate as of March 05, 2022 10:47 AM. If you have any questions, ask your nurse or doctor.          amLODipine 5 MG tablet Commonly known as: NORVASC Take 5 mg by mouth daily.   b complex vitamins capsule Take 1 capsule by mouth daily.   Calcium Carbonate 500 MG Chew Chew by mouth.   famotidine 40 MG tablet Commonly known as: PEPCID Take 40 mg by mouth daily.   magnesium oxide 400 MG tablet Commonly known as: MAG-OX Take 400 mg by mouth daily.   melatonin 1 MG Tabs tablet Take 1 mg by mouth at bedtime as needed.   omeprazole 20 MG capsule Commonly known as: PRILOSEC Take 1 capsule (20 mg total) by mouth 2 (two) times daily before a meal.   oxybutynin 5 MG tablet Commonly known as: DITROPAN Take 5 mg by mouth 3 (three) times daily.   pantoprazole 40 MG tablet Commonly known as: PROTONIX Take 40 mg by mouth 2 (two) times daily.   phentermine 37.5 MG tablet Commonly known as: ADIPEX-P Take 37.5  mg by mouth daily before breakfast.   simvastatin 20 MG tablet Commonly known as: ZOCOR Take 1 tablet by mouth at bedtime.   UNABLE TO FIND Take by mouth.        Allergies:  Allergies  Allergen Reactions   Hydrocodone-Acetaminophen Itching and Other (See Comments)    unsure   Sulfasalazine Nausea And Vomiting    Decreases WBC   Ciprofloxacin Nausea And Vomiting and Nausea Only     Exhaustion and indigestion   Codeine Itching   Metronidazole Nausea And Vomiting and Nausea Only    Exhaustion and indigestion   Sulfa Antibiotics Other (See Comments)    Decreases WBC  Decreased WBC  Decreased wbcs   Tramadol Nausea And Vomiting    Other Reaction(s): Other (See Comments)  GI Upset    Family History: No family history on file.  Social History:  has no history on file for tobacco use, alcohol use, and drug use.   Physical Exam: BP (!) 168/71   Pulse 93   Ht '5\' 2"'$  (1.575 m)   Wt 170 lb (77.1 kg)   BMI 31.09 kg/m   Constitutional:  Alert and oriented, No acute distress. HEENT: [place] AT Respiratory: Normal respiratory effort, no increased work of breathing. Neurologic: Grossly intact, no focal deficits, moving all 4 extremities. Psychiatric: Normal mood and affect.  Laboratory Data:  Urinalysis Pending   Assessment & Plan:    1.  Recurrent UTI We discussed the evaluation and treatment of patients with recurrent UTIs at length.  Possible etiologies of recurrent infection include periurethral tissue atrophy in postmenopausal woman, constipation, sexual activity, incomplete emptying, anatomic abnormalities, and even genetic predisposition.  Finally, we discussed the role of perineal hygiene, timed voiding, adequate hydration, topical vaginal estrogen, cranberry prophylaxis, and low-dose antibiotic prophylaxis. PVR today 0 mL She would like to start a supplement either cranberry or d-mannose She is also interested in starting low-dose vaginal estrogen and Rx sent to pharmacy Brief trial of low-dose antibiotic prophylaxis trimethoprim 100 mg daily x 3 months Follow-up office visit 3 months   Abbie Sons, MD  [facility] [address] [place], [place] [address] [phone]

## 2022-03-05 NOTE — Patient Instructions (Signed)
Take over the counter D-Mannose and Cranberry tablets daily

## 2022-03-06 ENCOUNTER — Encounter: Payer: Self-pay | Admitting: Urology

## 2022-04-19 ENCOUNTER — Ambulatory Visit: Payer: Medicare HMO | Admitting: Gastroenterology

## 2022-04-19 ENCOUNTER — Other Ambulatory Visit: Payer: Self-pay

## 2022-04-19 VITALS — BP 143/85 | HR 80 | Temp 98.3°F | Wt 174.0 lb

## 2022-04-19 DIAGNOSIS — K219 Gastro-esophageal reflux disease without esophagitis: Secondary | ICD-10-CM

## 2022-04-19 MED ORDER — OMEPRAZOLE 20 MG PO CPDR: 20.0000 mg | DELAYED_RELEASE_CAPSULE | Freq: Two times a day (BID) | ORAL | 1 refills | Status: DC

## 2022-04-19 NOTE — Patient Instructions (Addendum)
Please stop taking Famotidine, Pantoprazole, Lansoprazole. Please take Omeprazole 20 MG 1 capsule twice a day. This will be sent to your pharmacy [facility].

## 2022-04-19 NOTE — Progress Notes (Signed)
Jonathon Bellows MD, MRCP(U.K) [address]  [place]  [place], [place] [address]  Main: [phone]  Fax: [phone]   Primary Care Physician: Idelle Crouch, MD  Primary Gastroenterologist:  Dr. Jonathon Bellows   Chief Complaint  Patient presents with   Gastroesophageal Reflux    HPI: Gina Arnold is a 77 y.o. female   Summary of history :  Initially referred and seen on 02/15/2022 for symptoms of indigestion. She says that has been going on for a few months.  She said that her symptoms are predominantly heartburn which she wakes up in the morning no issues with swallowing she has a dinner an hour or 2 before bedtime but lays flat right after her dinner not on any PPI.  She has gained weight recently.  No other complaints.  Denies any abdominal pain. She also complains of recurrent UTIs and urinary and bladder incontinence going on for a while.  She recollects she was in prolonged labor during childbirth.  Her last colonoscopy was just a few years back and states she is not due for one yet.  Interval history   02/15/2022-04/19/2022  Atrial last visit she had some nocturnal reflux like symptoms and I commenced her on a PPI because she could not recollect being on any of these medications previously but today states that she potentially could be on lansoprazole Protonix Prilosec and Pepcid is unsure which she takes or does not take.  She does still have some minimal reflux-like symptoms.  No lower GI symptoms presently.  You to see a urologist.  She says that she has had a colonoscopy within the past 5 years.  Current Outpatient Medications  Medication Sig Dispense Refill   amLODipine (NORVASC) 5 MG tablet Take 5 mg by mouth daily.     b complex vitamins capsule Take 1 capsule by mouth daily.     Calcium Carbonate 500 MG CHEW Chew by mouth.     estradiol (ESTRACE) 0.1 MG/GM vaginal cream Apply a pea-sized amount to fingertip and wipe in vaginal introitus twice weekly 42.5 g  0   famotidine (PEPCID) 40 MG tablet Take 40 mg by mouth daily.     imipramine (TOFRANIL) 25 MG tablet Take 1 tablet by mouth at bedtime.     magnesium oxide (MAG-OX) 400 MG tablet Take 400 mg by mouth daily.     melatonin 1 MG TABS tablet Take 1 mg by mouth at bedtime as needed.     nitrofurantoin, macrocrystal-monohydrate, (MACROBID) 100 MG capsule Take 100 mg by mouth 2 (two) times daily.     oxybutynin (DITROPAN) 5 MG tablet Take 5 mg by mouth 3 (three) times daily.     phentermine (ADIPEX-P) 37.5 MG tablet Take 37.5 mg by mouth daily before breakfast.     simvastatin (ZOCOR) 20 MG tablet Take 1 tablet by mouth at bedtime.     trimethoprim (TRIMPEX) 100 MG tablet Take 1 tablet (100 mg total) by mouth daily. 30 tablet 2   UNABLE TO FIND Take by mouth.     lansoprazole (PREVACID) 30 MG capsule Take 30 mg by mouth daily at 12 noon. (Patient not taking: Reported on 04/19/2022)     omeprazole (PRILOSEC) 20 MG capsule Take 1 capsule (20 mg total) by mouth 2 (two) times daily before a meal. (Patient not taking: Reported on 04/19/2022) 180 capsule 0   pantoprazole (PROTONIX) 40 MG tablet Take 40 mg by mouth 2 (two) times daily. (Patient not taking: Reported  on 04/19/2022)     No current facility-administered medications for this visit.    Allergies as of 04/19/2022 - Review Complete 04/19/2022  Allergen Reaction Noted   Hydrocodone-acetaminophen Itching and Other (See Comments) 10/16/2013   Sulfasalazine Nausea And Vomiting 05/31/2016   Ciprofloxacin Nausea And Vomiting and Nausea Only 08/02/2016   Codeine Itching 11/10/2013   Metronidazole Nausea And Vomiting and Nausea Only 08/02/2016   Sulfa antibiotics Other (See Comments) 02/17/2015   Tramadol Nausea And Vomiting 02/17/2015    ROS:  General: Negative for anorexia, weight loss, fever, chills, fatigue, weakness. ENT: Negative for hoarseness, difficulty swallowing , nasal congestion. CV: Negative for chest pain, angina, palpitations,  dyspnea on exertion, peripheral edema.  Respiratory: Negative for dyspnea at rest, dyspnea on exertion, cough, sputum, wheezing.  GI: See history of present illness. GU:  Negative for dysuria, hematuria, urinary incontinence, urinary frequency, nocturnal urination.  Endo: Negative for unusual weight change.    Physical Examination:   BP (!) 143/85   Pulse 80   Temp 98.3 F (36.8 C) (Oral)   Wt 174 lb (78.9 kg)   BMI 31.83 kg/m   General: Well-nourished, well-developed in no acute distress.  Eyes: No icterus. Conjunctivae pink. Neuro: Alert and oriented x 3.  Grossly intact. Skin: Warm and dry, no jaundice.   Psych: Alert and cooperative, normal mood and affect.   Imaging Studies: No results found.  Assessment and Plan:   Gina Arnold is a 77 y.o. y/o female here to follow-up for dyspeptic symptoms possibly due to reflux.  Last visit I commenced on a PPI but it is unclear what she has taken in her active medication list she has 3 PPIs and prescription for Pepcid.   Plan 1.  We will discontinue all the active PPIs in her list and just place her on 1 and see how she does for 6 weeks.  If she still has persistent symptoms then we will proceed with upper endoscopy.  No red flag symptoms at this point of time.  Dr Jonathon Bellows  MD,MRCP [facility]) Follow up in 8 to 12 weeks

## 2022-04-19 NOTE — Addendum Note (Signed)
Addended by: Wayna Chalet on: 04/19/2022 02:15 PM   Modules accepted: Orders

## 2022-06-02 ENCOUNTER — Other Ambulatory Visit: Payer: Self-pay

## 2022-06-04 ENCOUNTER — Ambulatory Visit: Payer: Medicare HMO | Admitting: Urology

## 2022-06-08 ENCOUNTER — Ambulatory Visit: Payer: Medicare HMO | Admitting: Gastroenterology

## 2022-06-08 VITALS — BP 117/72 | HR 88 | Temp 98.3°F | Wt 172.6 lb

## 2022-06-08 DIAGNOSIS — K219 Gastro-esophageal reflux disease without esophagitis: Secondary | ICD-10-CM | POA: Diagnosis not present

## 2022-06-08 NOTE — Patient Instructions (Addendum)
Please start taking Omeprazole 40 MG twice a day and let us know in two weeks if this helps you with your acid reflux.

## 2022-06-08 NOTE — Progress Notes (Signed)
Jonathon Bellows MD, MRCP(U.K) [address]  [place]  [place], [place] [address]  Main: [phone]  Fax: [phone]   Primary Care Physician: Idelle Crouch, MD  Primary Gastroenterologist:  Dr. Jonathon Bellows   Chief Complaint  Patient presents with   Gastroesophageal Reflux    HPI: Gina Arnold is a 77 y.o. female Summary of history :   Initially referred and seen on 02/15/2022 for symptoms of indigestion.  She said that her symptoms are predominantly heartburn which she wakes up in the morning no issues with swallowing she has a dinner an hour or 2 before bedtime but lays flat right after her dinner not on any PPI.  She had gained weight recently.  No other complaints.  Denies any abdominal pain. She also complains of recurrent UTIs and urinary and bladder incontinence going on for a while.  She recollects she was in prolonged labor during childbirth.  Her last colonoscopy was just a few years back and states she is not due for one yet.   Interval history   04/19/2022-06/08/2022   At her last visit we placed her on a single PPI prior to which she was on multiple medications to see how she does for 6 weeks.  She has been taking for over 6 weeks Prilosec twice a day.  Despite taking it she continues to have symptoms of heartburn.   Current Outpatient Medications  Medication Sig Dispense Refill   amLODipine (NORVASC) 5 MG tablet Take 1 tablet by mouth daily.     b complex vitamins capsule Take 1 capsule by mouth daily.     Calcium Carbonate 500 MG CHEW Chew by mouth.     estradiol (ESTRACE) 0.1 MG/GM vaginal cream Apply a pea-sized amount to fingertip and wipe in vaginal introitus twice weekly 42.5 g 0   imipramine (TOFRANIL) 25 MG tablet Take 1 tablet by mouth at bedtime.     magnesium oxide (MAG-OX) 400 MG tablet Take 400 mg by mouth daily.     melatonin 1 MG TABS tablet Take 1 mg by mouth at bedtime as needed.     nitrofurantoin, macrocrystal-monohydrate, (MACROBID)  100 MG capsule Take 100 mg by mouth 2 (two) times daily.     omeprazole (PRILOSEC) 20 MG capsule Take 1 capsule (20 mg total) by mouth 2 (two) times daily before a meal. 180 capsule 1   oxybutynin (DITROPAN) 5 MG tablet Take 5 mg by mouth 3 (three) times daily.     phentermine (ADIPEX-P) 37.5 MG tablet Take 37.5 mg by mouth daily before breakfast.     simvastatin (ZOCOR) 20 MG tablet Take 1 tablet by mouth at bedtime.     trimethoprim (TRIMPEX) 100 MG tablet Take 1 tablet (100 mg total) by mouth daily. 30 tablet 2   UNABLE TO FIND Take by mouth.     No current facility-administered medications for this visit.    Allergies as of 06/08/2022 - Review Complete 06/08/2022  Allergen Reaction Noted   Hydrocodone-acetaminophen Itching and Other (See Comments) 10/16/2013   Sulfasalazine Nausea And Vomiting 05/31/2016   Ciprofloxacin Nausea And Vomiting and Nausea Only 08/02/2016   Codeine Itching 11/10/2013   Metronidazole Nausea And Vomiting and Nausea Only 08/02/2016   Sulfa antibiotics Other (See Comments) 02/17/2015   Tramadol Nausea And Vomiting 02/17/2015    ROS:  General: Negative for anorexia, weight loss, fever, chills, fatigue, weakness. ENT: Negative for hoarseness, difficulty swallowing , nasal congestion. CV: Negative for chest pain, angina,  palpitations, dyspnea on exertion, peripheral edema.  Respiratory: Negative for dyspnea at rest, dyspnea on exertion, cough, sputum, wheezing.  GI: See history of present illness. GU:  Negative for dysuria, hematuria, urinary incontinence, urinary frequency, nocturnal urination.  Endo: Negative for unusual weight change.    Physical Examination:   BP (!) 145/73   Pulse 97   Temp 98.3 F (36.8 C) (Oral)   Wt 172 lb 9.6 oz (78.3 kg)   BMI 31.57 kg/m   General: Well-nourished, well-developed in no acute distress.  Eyes: No icterus. Conjunctivae pink. Mouth: Oropharyngeal mucosa moist and pink , no lesions erythema or exudate. Neuro:  Alert and oriented x 3.  Grossly intact. Skin: Warm and dry, no jaundice.   Psych: Alert and cooperative, normal mood and affect.   Imaging Studies: No results found.  Assessment and Plan:   Gina Arnold is a 77 y.o. y/o female here to follow-up for dyspeptic symptoms possibly due to reflux.  She has tried multiple PPIs and Pepcid and has had no relief.  I recommended we perform an upper endoscopy but she is not keen at this point of time would like to try something else I explained to her the importance of an upper endoscopy she said that she will try an alternative medication such as Voquenza and if it does not help we will proceed with upper endoscopy.  I will give her samples for 1 week and we will see her back in a couple of weeks time if no better would recommend to proceed with upper endoscopy strongly     Dr Jonathon Bellows  MD,MRCP [facility]) Follow up in 4 to 6 weeks

## 2022-06-11 ENCOUNTER — Other Ambulatory Visit: Payer: Self-pay | Admitting: Urology

## 2022-06-11 NOTE — Telephone Encounter (Signed)
The trimethoprim was going to be a 58-month trial giving time for the vaginal estrogen to take effect.  Would not recommend refilling at this time and keeping scheduled follow-up April 2024    Notified patient as instructed, patient pleased. Discussed follow-up appointments, patient agrees

## 2022-06-23 ENCOUNTER — Encounter: Payer: Self-pay | Admitting: Urology

## 2022-06-23 ENCOUNTER — Ambulatory Visit: Payer: Medicare HMO | Admitting: Urology

## 2022-06-23 VITALS — BP 138/70 | HR 76 | Ht 62.0 in | Wt 170.0 lb

## 2022-06-23 DIAGNOSIS — Z87440 Personal history of urinary (tract) infections: Secondary | ICD-10-CM | POA: Diagnosis not present

## 2022-06-23 DIAGNOSIS — N390 Urinary tract infection, site not specified: Secondary | ICD-10-CM

## 2022-06-23 LAB — MICROSCOPIC EXAMINATION: Bacteria, UA: NONE SEEN

## 2022-06-23 LAB — URINALYSIS, COMPLETE
Bilirubin, UA: NEGATIVE
Glucose, UA: NEGATIVE
Ketones, UA: NEGATIVE
Leukocytes,UA: NEGATIVE
Nitrite, UA: NEGATIVE
Protein,UA: NEGATIVE
RBC, UA: NEGATIVE
Specific Gravity, UA: 1.01 (ref 1.005–1.030)
Urobilinogen, Ur: 0.2 mg/dL (ref 0.2–1.0)
pH, UA: 7 (ref 5.0–7.5)

## 2022-06-23 NOTE — Patient Instructions (Signed)
D-mannose

## 2022-06-23 NOTE — Progress Notes (Signed)
I, DeAsia L Maxie,acting as a scribe for Abbie Sons, MD.,have documented all relevant documentation on the behalf of Abbie Sons, MD,as directed by  Abbie Sons, MD while in the presence of Abbie Sons, MD.   06/23/22 10:30 AM   [name] Jun 20, 1945 [id number]  Referring provider: Idelle Crouch, MD [facility] [place],  [place] [address]  Chief Complaint  Patient presents with   Urinary Frequency    HPI: Gina Arnold is a 77 y.o. female who presents for 3 month follow-up for recurrent UTI.  Initially seen 03/05/22. Elected to start low dose vaginal estrogen cream and was placed on low dose prophylaxis x 3 months. Remains on estrogen cream Taking D-mannose supplement Has been symptom free/UTI free since the last visit She completed her course of Trimethoprim 1-2 weeks ago She also complains of fecal incontinence without urge or awareness until after the event.   PMH: Past Medical History:  Diagnosis Date   Hypertension     Home Medications:  Allergies as of 06/23/2022       Reactions   Hydrocodone-acetaminophen Itching, Other (See Comments)   unsure   Sulfasalazine Nausea And Vomiting   Decreases WBC   Ciprofloxacin Nausea And Vomiting, Nausea Only   Exhaustion and indigestion   Codeine Itching   Metronidazole Nausea And Vomiting, Nausea Only   Exhaustion and indigestion   Sulfa Antibiotics Other (See Comments)   Decreases WBC Decreased WBC Decreased wbcs   Tramadol Nausea And Vomiting   Other Reaction(s): Other (See Comments) GI Upset        Medication List        Accurate as of June 23, 2022 10:30 AM. If you have any questions, ask your nurse or doctor.          amLODipine 5 MG tablet Commonly known as: NORVASC Take 1 tablet by mouth daily.   b complex vitamins capsule Take 1 capsule by mouth daily.   Calcium Carbonate 500 MG Chew Chew by mouth.   estradiol 0.1 MG/GM vaginal  cream Commonly known as: ESTRACE Apply a pea-sized amount to fingertip and wipe in vaginal introitus twice weekly   imipramine 25 MG tablet Commonly known as: TOFRANIL Take 1 tablet by mouth at bedtime.   magnesium oxide 400 MG tablet Commonly known as: MAG-OX Take 400 mg by mouth daily.   melatonin 1 MG Tabs tablet Take 1 mg by mouth at bedtime as needed.   nitrofurantoin (macrocrystal-monohydrate) 100 MG capsule Commonly known as: MACROBID Take 100 mg by mouth 2 (two) times daily.   omeprazole 20 MG capsule Commonly known as: PRILOSEC Take 1 capsule (20 mg total) by mouth 2 (two) times daily before a meal.   oxybutynin 5 MG tablet Commonly known as: DITROPAN Take 5 mg by mouth 3 (three) times daily.   phentermine 37.5 MG tablet Commonly known as: ADIPEX-P Take 37.5 mg by mouth daily before breakfast.   simvastatin 20 MG tablet Commonly known as: ZOCOR Take 1 tablet by mouth at bedtime.   trimethoprim 100 MG tablet Commonly known as: TRIMPEX Take 1 tablet (100 mg total) by mouth daily.   UNABLE TO FIND Take by mouth.        Allergies:  Allergies  Allergen Reactions   Hydrocodone-Acetaminophen Itching and Other (See Comments)    unsure   Sulfasalazine Nausea And Vomiting    Decreases WBC   Ciprofloxacin Nausea And Vomiting and Nausea Only  Exhaustion and indigestion   Codeine Itching   Metronidazole Nausea And Vomiting and Nausea Only    Exhaustion and indigestion   Sulfa Antibiotics Other (See Comments)    Decreases WBC  Decreased WBC  Decreased wbcs   Tramadol Nausea And Vomiting    Other Reaction(s): Other (See Comments)  GI Upset    Social History:  has no history on file for tobacco use, alcohol use, and drug use.   Physical Exam: BP 138/70   Pulse 76   Ht 5\' 2"  (1.575 m)   Wt 170 lb (77.1 kg)   BMI 31.09 kg/m   Constitutional:  Alert and oriented, No acute distress. HEENT: [place] AT Respiratory: Normal respiratory effort, no  increased work of breathing. Psychiatric: Normal mood and affect.   Assessment & Plan:    Recurrent UTI  She was unable to give a urine specimen and if unable to provide before she leaves will bring in a sterile specimen later today. Continue vaginal estrogen and D-mannose Did not recommend resuming low dose antibiotic prophylaxis unless she has recurrent infections. Instructed to call for UTI symptoms and will otherwise follow up in six months. Discussed that our specialty does not treat fecal incontinence. She does have a gastroenterologist and we will discuss this with him.  I have reviewed the above documentation for accuracy and completeness, and I agree with the above.   Abbie Sons, [place] [address] [place], [place] [address] [phone]

## 2022-07-19 ENCOUNTER — Other Ambulatory Visit: Payer: Self-pay

## 2022-07-20 ENCOUNTER — Ambulatory Visit: Payer: Medicare HMO | Admitting: Gastroenterology

## 2022-07-20 VITALS — BP 127/75 | HR 89 | Temp 98.4°F | Ht 62.5 in | Wt 170.0 lb

## 2022-07-20 DIAGNOSIS — K219 Gastro-esophageal reflux disease without esophagitis: Secondary | ICD-10-CM

## 2022-07-20 DIAGNOSIS — R159 Full incontinence of feces: Secondary | ICD-10-CM

## 2022-07-20 NOTE — Progress Notes (Signed)
Gina Mood MD, MRCP(U.K) [address]  [address]  [place], [place] [address]  Main: [phone]  Fax: [phone]   Primary Care Physician: Gina Arbour, MD  Primary Gastroenterologist:  Dr. Wyline Arnold   Chief Complaint  Patient presents with   Follow-up    HPI: Gina Arnold is a 77 y.o. female Summary of history :   Initially referred and seen on 02/15/2022 for symptoms of indigestion.  She said that her symptoms are predominantly heartburn which she wakes up in the morning no issues with swallowing she has a dinner an hour or 2 before bedtime but lays flat right after her dinner not on any PPI.  She had gained weight recently.  No other complaints.  Denies any abdominal pain. She also complains of recurrent UTIs and urinary and bladder incontinence going on for a while.  She recollects she was in prolonged labor during childbirth.  Her last colonoscopy was just a few years back and states she is not due for one yet.   Interval history  06/08/2022-07/20/2022   At her last visit I wanted to give her a sample of vonoprazan as she was having reflux despite being on a PPI unfortunately we do not have any samples and we increased her PPI to twice a day omeprazole 40 mg despite which she continues to have symptoms of heartburn.  She also mention that for the past few months if not longer she has features of incontinence which she is not aware of and may have an accident she also has some dribbling of urine.  She denies any awareness of any perineal tear during the process of childbirth.  Denies any loose stools denies any constipation recalls her last colonoscopy was many years back. Current Outpatient Medications  Medication Sig Dispense Refill   amLODipine (NORVASC) 5 MG tablet Take 1 tablet by mouth daily.     b complex vitamins capsule Take 1 capsule by mouth daily.     Calcium Carbonate 500 MG CHEW Chew by mouth.     estradiol (ESTRACE) 0.1 MG/GM vaginal cream Apply  a pea-sized amount to fingertip and wipe in vaginal introitus twice weekly 42.5 g 0   famotidine (PEPCID) 40 MG tablet Take 40 mg by mouth daily.     imipramine (TOFRANIL) 25 MG tablet Take 1 tablet by mouth at bedtime.     magnesium oxide (MAG-OX) 400 MG tablet Take 400 mg by mouth daily.     melatonin 1 MG TABS tablet Take 1 mg by mouth at bedtime as needed.     nitrofurantoin, macrocrystal-monohydrate, (MACROBID) 100 MG capsule Take 100 mg by mouth 2 (two) times daily.     omeprazole (PRILOSEC) 20 MG capsule Take 1 capsule (20 mg total) by mouth 2 (two) times daily before a meal. 180 capsule 1   oxybutynin (DITROPAN) 5 MG tablet Take 5 mg by mouth 3 (three) times daily.     phentermine (ADIPEX-P) 37.5 MG tablet Take 37.5 mg by mouth daily before breakfast.     simvastatin (ZOCOR) 20 MG tablet Take 1 tablet by mouth at bedtime.     topiramate (TOPAMAX) 50 MG tablet Take 50 mg by mouth daily.     trimethoprim (TRIMPEX) 100 MG tablet Take 1 tablet (100 mg total) by mouth daily. 30 tablet 2   UNABLE TO FIND Take by mouth.     No current facility-administered medications for this visit.    Allergies as of 07/20/2022 - Review  Complete 07/20/2022  Allergen Reaction Noted   Hydrocodone-acetaminophen Itching and Other (See Comments) 10/16/2013   Sulfasalazine Nausea And Vomiting 05/31/2016   Ciprofloxacin Nausea And Vomiting and Nausea Only 08/02/2016   Codeine Itching 11/10/2013   Metronidazole Nausea And Vomiting and Nausea Only 08/02/2016   Sulfa antibiotics Other (See Comments) 02/17/2015   Tramadol Nausea And Vomiting 02/17/2015      ROS:  General: Negative for anorexia, weight loss, fever, chills, fatigue, weakness. ENT: Negative for hoarseness, difficulty swallowing , nasal congestion. CV: Negative for chest pain, angina, palpitations, dyspnea on exertion, peripheral edema.  Respiratory: Negative for dyspnea at rest, dyspnea on exertion, cough, sputum, wheezing.  GI: See history  of present illness. GU:  Negative for dysuria, hematuria, urinary incontinence, urinary frequency, nocturnal urination.  Endo: Negative for unusual weight change.    Physical Examination:   BP 127/75   Pulse 89   Temp 98.4 F (36.9 C) (Oral)   Ht 5' 2.5" (1.588 m)   Wt 170 lb (77.1 kg)   BMI 30.60 kg/m   General: Well-nourished, well-developed in no acute distress.  Eyes: No icterus. Conjunctivae pink. Neuro: Alert and oriented x 3.  Grossly intact. Skin: Warm and dry, no jaundice.   Psych: Alert and cooperative, normal Arnold and affect.   Imaging Studies: No results found.  Assessment and Plan:   Gina Arnold is a 77 y.o. y/o female here to follow-up for dyspeptic symptoms possibly due to reflux.  Failed treatment with Prilosec 40 mg twice a day previously I have suggested upper endoscopy she has been reluctant we will give her a sample of vonoprazan for 10 days and have clearly explained to her that if the symptoms persist we would need to perform upper endoscopy.  In terms of her fecal incontinence it could be related to loss of anal tone and nerve damage but since she has not had endoscopic evaluation for many years I suggested the least we could do the flexible sigmoidoscopy unsedated to examine the perianal area, rectal vault and rule out any masses.  If negative we could refer her for GYN urology to evaluate further.    Dr Gina Mood  MD,MRCP [facility]) Follow up in 4 weeks video visit

## 2022-07-20 NOTE — Progress Notes (Signed)
Patient decline on the flexible sigmoidoscopy. She wanted to see how she does on vonoprazan.

## 2022-07-29 DIAGNOSIS — E6609 Other obesity due to excess calories: Secondary | ICD-10-CM | POA: Insufficient documentation

## 2022-07-30 ENCOUNTER — Other Ambulatory Visit: Payer: Self-pay | Admitting: Internal Medicine

## 2022-07-30 DIAGNOSIS — Z1231 Encounter for screening mammogram for malignant neoplasm of breast: Secondary | ICD-10-CM

## 2022-08-04 ENCOUNTER — Telehealth: Payer: Self-pay | Admitting: Gastroenterology

## 2022-08-04 NOTE — Telephone Encounter (Signed)
Called patient back and she stated that she was taking Vonoprazan daily but no longer have any more. However, she stated that her son died unexpectedly and she's been all over the place so she is not sure if it really helped her or not. She wants to know if she could get more samples and then let us know if it helped or not in 10 days. I told her that I would ask Dr. Tobi Bastos and then let her know. Please advise.

## 2022-08-04 NOTE — Telephone Encounter (Signed)
Pt left message to discuss medication that Dr. Tobi Bastos wanted her to try please return call

## 2022-08-05 NOTE — Telephone Encounter (Signed)
Yes give samples

## 2022-08-10 ENCOUNTER — Other Ambulatory Visit: Payer: Self-pay

## 2022-08-10 ENCOUNTER — Ambulatory Visit: Payer: Medicare HMO | Admitting: Gastroenterology

## 2022-08-10 VITALS — BP 128/70 | HR 85 | Temp 98.7°F | Wt 166.0 lb

## 2022-08-10 DIAGNOSIS — K219 Gastro-esophageal reflux disease without esophagitis: Secondary | ICD-10-CM

## 2022-08-10 DIAGNOSIS — R159 Full incontinence of feces: Secondary | ICD-10-CM

## 2022-08-10 NOTE — Progress Notes (Signed)
Wyline Mood MD, MRCP(U.K) [address]  [address]  [place], [place] [address]  Main: [phone]  Fax: [phone]   Primary Care Physician: Marguarite Arbour, MD  Primary Gastroenterologist:  Dr. Wyline Mood   Chief Complaint  Patient presents with   Gastroesophageal Reflux    HPI: Gina Arnold is a 77 y.o. female  Summary of history :   Initially referred and seen on 02/15/2022 for symptoms of indigestion.  She said that her symptoms are predominantly heartburn which she wakes up in the morning no issues with swallowing she has a dinner an hour or 2 before bedtime but lays flat right after her dinner not on any PPI.  She had gained weight recently.  No other complaints.  Denies any abdominal pain.  She also complains of recurrent UTIs and urinary and bladder incontinence going on for a while.  She recollects she was in prolonged labor during childbirth.  Her last colonoscopy was just a few years back and states she is not due for one yet.   Interval history 07/20/2022-08/10/2022   Despite a trial of vonoprazan continues to have reflux-like symptoms it has not helped.  She also pointed to have on and off fecal incontinence.     Current Outpatient Medications  Medication Sig Dispense Refill   amLODipine (NORVASC) 5 MG tablet Take 1 tablet by mouth daily.     b complex vitamins capsule Take 1 capsule by mouth daily.     Calcium Carbonate 500 MG CHEW Chew by mouth.     estradiol (ESTRACE) 0.1 MG/GM vaginal cream Apply a pea-sized amount to fingertip and wipe in vaginal introitus twice weekly 42.5 g 0   imipramine (TOFRANIL) 25 MG tablet Take 1 tablet by mouth at bedtime.     magnesium oxide (MAG-OX) 400 MG tablet Take 400 mg by mouth daily.     melatonin 1 MG TABS tablet Take 1 mg by mouth at bedtime as needed.     nitrofurantoin, macrocrystal-monohydrate, (MACROBID) 100 MG capsule Take 100 mg by mouth 2 (two) times daily.     oxybutynin (DITROPAN) 5 MG tablet Take 5  mg by mouth 3 (three) times daily.     phentermine (ADIPEX-P) 37.5 MG tablet Take 37.5 mg by mouth daily before breakfast.     simvastatin (ZOCOR) 20 MG tablet Take 1 tablet by mouth at bedtime.     tirzepatide (ZEPBOUND) 5 MG/0.5ML Pen Inject 5 mg into the skin once a week.     topiramate (TOPAMAX) 50 MG tablet Take 50 mg by mouth daily.     trimethoprim (TRIMPEX) 100 MG tablet Take 1 tablet (100 mg total) by mouth daily. 30 tablet 2   UNABLE TO FIND Take by mouth.     Vonoprazan Fumarate (VOQUEZNA) 20 MG TABS Take 1 tablet by mouth daily.     No current facility-administered medications for this visit.    Allergies as of 08/10/2022 - Review Complete 07/20/2022  Allergen Reaction Noted   Hydrocodone-acetaminophen Itching and Other (See Comments) 10/16/2013   Sulfasalazine Nausea And Vomiting 05/31/2016   Ciprofloxacin Nausea And Vomiting and Nausea Only 08/02/2016   Codeine Itching 11/10/2013   Metronidazole Nausea And Vomiting and Nausea Only 08/02/2016   Sulfa antibiotics Other (See Comments) 02/17/2015   Tramadol Nausea And Vomiting 02/17/2015    ROS:  General: Negative for anorexia, weight loss, fever, chills, fatigue, weakness. ENT: Negative for hoarseness, difficulty swallowing , nasal congestion. CV: Negative for chest pain, angina,  palpitations, dyspnea on exertion, peripheral edema.  Respiratory: Negative for dyspnea at rest, dyspnea on exertion, cough, sputum, wheezing.  GI: See history of present illness. GU:  Negative for dysuria, hematuria, urinary incontinence, urinary frequency, nocturnal urination.  Endo: Negative for unusual weight change.    Physical Examination:   BP 128/70   Pulse 85   Temp 98.7 F (37.1 C) (Oral)   Wt 166 lb (75.3 kg)   BMI 29.88 kg/m   General: Well-nourished, well-developed in no acute distress.  Eyes: No icterus. Conjunctivae pink. Neuro: Alert and oriented x 3.  Grossly intact. Skin: Warm and dry, no jaundice.   Psych: Alert  and cooperative, normal mood and affect.   Imaging Studies: No results found.  Assessment and Plan:   Gina Arnold is a 77 y.o. y/o female here to follow-up for dyspeptic symptoms possibly due to reflux.  Failed treatment with Prilosec 40 mg twice a day previously, at her last visit commenced her on Voquenza which has not helped continues to have on and off fecal incontinence she is willing to pursue EGD to evaluate further plus or minus impedance pH testing as well as flexible sigmoidoscopy at the same time.  We will see her back after the procedure.  If negative we could refer her for GYN urology to evaluate further.   I have discussed alternative options, risks & benefits,  which include, but are not limited to, bleeding, infection, perforation,respiratory complication & drug reaction.  The patient agrees with this plan & written consent will be obtained.     Dr Wyline Mood  MD,MRCP [facility]) Follow up in 8 weeks

## 2022-08-31 ENCOUNTER — Telehealth: Payer: Self-pay | Admitting: Gastroenterology

## 2022-08-31 ENCOUNTER — Encounter: Payer: Self-pay | Admitting: Gastroenterology

## 2022-08-31 NOTE — Telephone Encounter (Signed)
Called patient back and patient stated that she wanted to make sure that she was taking the right amount of Miralax for her procedure tomorrow. I clarified the amount to her as to take half on the bottle of Miralax. Patient understood and had no further questions.

## 2022-08-31 NOTE — Telephone Encounter (Signed)
Pt left vm has question in ref to endoscopy  taking miralax please return call

## 2022-09-01 ENCOUNTER — Ambulatory Visit: Payer: Medicare HMO | Admitting: Anesthesiology

## 2022-09-01 ENCOUNTER — Encounter: Payer: Self-pay | Admitting: Gastroenterology

## 2022-09-01 ENCOUNTER — Other Ambulatory Visit: Payer: Self-pay

## 2022-09-01 ENCOUNTER — Encounter
Admission: RE | Disposition: A | Discharge status: Home / Self Care | Payer: Self-pay | Source: Ambulatory Visit | Attending: Gastroenterology

## 2022-09-01 ENCOUNTER — Ambulatory Visit
Admission: RE | Admit: 2022-09-01 | Discharge: 2022-09-01 | Disposition: A | Discharge status: Home / Self Care | Payer: Medicare HMO | Source: Ambulatory Visit | Attending: Gastroenterology | Admitting: Gastroenterology

## 2022-09-01 DIAGNOSIS — Z79899 Other long term (current) drug therapy: Secondary | ICD-10-CM | POA: Insufficient documentation

## 2022-09-01 DIAGNOSIS — K229 Disease of esophagus, unspecified: Secondary | ICD-10-CM | POA: Diagnosis not present

## 2022-09-01 DIAGNOSIS — I10 Essential (primary) hypertension: Secondary | ICD-10-CM | POA: Insufficient documentation

## 2022-09-01 DIAGNOSIS — R159 Full incontinence of feces: Secondary | ICD-10-CM | POA: Diagnosis present

## 2022-09-01 DIAGNOSIS — I340 Nonrheumatic mitral (valve) insufficiency: Secondary | ICD-10-CM | POA: Insufficient documentation

## 2022-09-01 DIAGNOSIS — K6289 Other specified diseases of anus and rectum: Secondary | ICD-10-CM | POA: Insufficient documentation

## 2022-09-01 DIAGNOSIS — K317 Polyp of stomach and duodenum: Secondary | ICD-10-CM | POA: Diagnosis not present

## 2022-09-01 DIAGNOSIS — K219 Gastro-esophageal reflux disease without esophagitis: Secondary | ICD-10-CM

## 2022-09-01 DIAGNOSIS — K639 Disease of intestine, unspecified: Secondary | ICD-10-CM | POA: Insufficient documentation

## 2022-09-01 DIAGNOSIS — Z87891 Personal history of nicotine dependence: Secondary | ICD-10-CM | POA: Diagnosis not present

## 2022-09-01 HISTORY — PX: ESOPHAGOGASTRODUODENOSCOPY (EGD) WITH PROPOFOL: SHX5813

## 2022-09-01 HISTORY — PX: FLEXIBLE SIGMOIDOSCOPY: SHX5431

## 2022-09-01 HISTORY — DX: Gastro-esophageal reflux disease without esophagitis: K21.9

## 2022-09-01 SURGERY — ESOPHAGOGASTRODUODENOSCOPY (EGD) WITH PROPOFOL
Anesthesia: General

## 2022-09-01 MED ORDER — DEXMEDETOMIDINE HCL IN NACL 80 MCG/20ML IV SOLN
INTRAVENOUS | Status: DC | PRN
  Administered 2022-09-01: 16 ug via INTRAVENOUS

## 2022-09-01 MED ORDER — SODIUM CHLORIDE 0.9 % IV SOLN: INTRAVENOUS | Status: DC

## 2022-09-01 MED ORDER — GLYCOPYRROLATE 0.2 MG/ML IJ SOLN
INTRAMUSCULAR | Status: DC | PRN
  Administered 2022-09-01: .1 mg via INTRAVENOUS

## 2022-09-01 MED ORDER — PROPOFOL 500 MG/50ML IV EMUL
INTRAVENOUS | Status: DC | PRN
  Administered 2022-09-01: 100 ug/kg/min via INTRAVENOUS

## 2022-09-01 MED ORDER — LIDOCAINE HCL (CARDIAC) PF 100 MG/5ML IV SOSY
PREFILLED_SYRINGE | INTRAVENOUS | Status: DC | PRN
  Administered 2022-09-01: 50 mg via INTRAVENOUS

## 2022-09-01 MED ORDER — PROPOFOL 10 MG/ML IV BOLUS
INTRAVENOUS | Status: DC | PRN
  Administered 2022-09-01: 70 mg via INTRAVENOUS

## 2022-09-01 NOTE — Transfer of Care (Signed)
Immediate Anesthesia Transfer of Care Note  Patient: Gina Arnold  Procedure(s) Performed: ESOPHAGOGASTRODUODENOSCOPY (EGD) WITH PROPOFOL FLEXIBLE SIGMOIDOSCOPY  Patient Location: PACU and Endoscopy Unit  Anesthesia Type:General  Level of Consciousness: drowsy and patient cooperative  Airway & Oxygen Therapy: Patient Spontanous Breathing  Post-op Assessment: Report given to RN and Post -op Vital signs reviewed and stable  Post vital signs: Reviewed and stable  Last Vitals:  Vitals Value Taken Time  BP 87/52 09/01/22 1318  Temp    Pulse 71 09/01/22 1318  Resp 18 09/01/22 1318  SpO2 98 % 09/01/22 1318  Vitals shown include unvalidated device data.  Last Pain:  Vitals:   09/01/22 1317  TempSrc:   PainSc: 0-No pain         Complications: No notable events documented.

## 2022-09-01 NOTE — Op Note (Signed)
[facility] Gastroenterology Patient Name: Gina Arnold Procedure Date: 09/01/2022 12:44 PM MRN: [id number] Account #: [id number] Date of Birth: 04/18/1945 Admit Type: Outpatient Age: 77 Room: [facility] ENDO ROOM 3 Gender: Female Note Status: Finalized Instrument Name: Upper Endoscope [id number] Procedure:             Upper GI endoscopy Indications:           Follow-up of gastro-esophageal reflux disease Providers:             Wyline Mood MD, MD Referring MD:          Duane Lope. Judithann Sheen, MD (Referring MD) Medicines:             Monitored Anesthesia Care Complications:         No immediate complications. Procedure:             Pre-Anesthesia Assessment:                        - Prior to the procedure, a History and Physical was                         performed, and patient medications, allergies and                         sensitivities were reviewed. The patient's tolerance                         of previous anesthesia was reviewed.                        - The risks and benefits of the procedure and the                         sedation options and risks were discussed with the                         patient. All questions were answered and informed                         consent was obtained.                        - ASA Grade Assessment: II - A patient with mild                         systemic disease.                        After obtaining informed consent, the endoscope was                         passed under direct vision. Throughout the procedure,                         the patient's blood pressure, pulse, and oxygen                         saturations were monitored continuously. The Endoscope  was introduced through the mouth, and advanced to the                         third part of duodenum. The upper GI endoscopy was                         accomplished with ease. The patient tolerated the                         procedure  well. Findings:      The examined duodenum was normal.      The examined esophagus was normal. Biopsies were taken with a cold       forceps for histology.      Two 5 to 7 mm sessile polyps with no bleeding and no stigmata of recent       bleeding were found in the cardia. Biopsies were taken with a cold       forceps for histology.      The cardia and gastric fundus were normal on retroflexion. Impression:            - Normal examined duodenum.                        - Normal esophagus. Biopsied.                        - Two gastric polyps. Biopsied. Recommendation:        - Await pathology results.                        - Perform a flexible sigmoidoscopy today. Procedure Code(s):     --- Professional ---                        [phone], Esophagogastroduodenoscopy, flexible,                         transoral; with biopsy, single or multiple Diagnosis Code(s):     --- Professional ---                        K31.7, Polyp of stomach and duodenum                        K21.9, Gastro-esophageal reflux disease without                         esophagitis CPT copyright 2022 American Medical Association. All rights reserved. The codes documented in this report are preliminary and upon coder review may  be revised to meet current compliance requirements. Wyline Mood, MD Wyline Mood MD, MD 09/01/2022 1:07:20 PM This report has been signed electronically. Number of Addenda: 0 Note Initiated On: 09/01/2022 12:44 PM Estimated Blood Loss:  Estimated blood loss: none.      [facility]

## 2022-09-01 NOTE — Anesthesia Postprocedure Evaluation (Signed)
Anesthesia Post Note  Patient: Gina Arnold  Procedure(s) Performed: ESOPHAGOGASTRODUODENOSCOPY (EGD) WITH PROPOFOL FLEXIBLE SIGMOIDOSCOPY  Patient location during evaluation: PACU Anesthesia Type: General Level of consciousness: awake and alert, oriented and patient cooperative Pain management: pain level controlled Vital Signs Assessment: post-procedure vital signs reviewed and stable Respiratory status: spontaneous breathing, nonlabored ventilation and respiratory function stable Cardiovascular status: blood pressure returned to baseline and stable Postop Assessment: adequate PO intake Anesthetic complications: no   No notable events documented.   Last Vitals:  Vitals:   09/01/22 1326 09/01/22 1337  BP:  (!) 98/50  Pulse:    Resp:    Temp: (!) 36.3 C   SpO2:      Last Pain:  Vitals:   09/01/22 1337  TempSrc:   PainSc: 0-No pain                 Reed Breech

## 2022-09-01 NOTE — H&P (Signed)
Wyline Mood, MD [address], [place], [place], [address] [address], [place], [place], [address] Phone: [phone]  Fax: [phone]  Primary Care Physician:  Marguarite Arbour, MD   Pre-Procedure History & Physical: HPI:  Gina Arnold is a 77 y.o. female is here for an endoscopy and sigmoidoscopy    Past Medical History:  Diagnosis Date   GERD (gastroesophageal reflux disease)    Hypertension     No past surgical history on file.  Prior to Admission medications   Medication Sig Start Date End Date Taking? Authorizing Provider  amLODipine (NORVASC) 5 MG tablet Take 1 tablet by mouth daily. 04/15/22   [provider]  b complex vitamins capsule Take 1 capsule by mouth daily.    [provider]  Calcium Carbonate 500 MG CHEW Chew by mouth.    [provider]  estradiol (ESTRACE) 0.1 MG/GM vaginal cream Apply a pea-sized amount to fingertip and wipe in vaginal introitus twice weekly 03/05/22   Stoioff, Verna Czech, MD  imipramine (TOFRANIL) 25 MG tablet Take 1 tablet by mouth at bedtime. 12/23/21 12/23/22  [provider]  magnesium oxide (MAG-OX) 400 MG tablet Take 400 mg by mouth daily.    [provider]  melatonin 1 MG TABS tablet Take 1 mg by mouth at bedtime as needed.    [provider]  nitrofurantoin, macrocrystal-monohydrate, (MACROBID) 100 MG capsule Take 100 mg by mouth 2 (two) times daily. 02/25/22   [provider]  oxybutynin (DITROPAN) 5 MG tablet Take 5 mg by mouth 3 (three) times daily. 08/27/20   [provider]  phentermine (ADIPEX-P) 37.5 MG tablet Take 37.5 mg by mouth daily before breakfast. 09/23/21   [provider]  simvastatin (ZOCOR) 20 MG tablet Take 1 tablet by mouth at bedtime. 03/24/21   [provider]  tirzepatide (ZEPBOUND) 5 MG/0.5ML Pen Inject 5 mg into the skin once a week. Patient not taking: Reported on 08/25/2022 07/29/22   [provider]  topiramate (TOPAMAX) 50 MG tablet Take 50 mg by mouth daily.    [provider]  trimethoprim (TRIMPEX) 100 MG tablet Take 1 tablet (100 mg total) by mouth daily. 03/05/22   Stoioff, Verna Czech, MD  UNABLE TO FIND Take by mouth.    [provider]  Vonoprazan Fumarate (VOQUEZNA) 20 MG TABS Take 1 tablet by mouth daily.    [provider]    Allergies as of 08/10/2022 - Review Complete 08/10/2022  Allergen Reaction Noted   Hydrocodone-acetaminophen Itching and Other (See Comments) 10/16/2013   Sulfasalazine Nausea And Vomiting 05/31/2016   Ciprofloxacin Nausea And Vomiting and Nausea Only 08/02/2016   Codeine Itching 11/10/2013   Metronidazole Nausea And Vomiting and Nausea Only 08/02/2016   Sulfa antibiotics Other (See Comments) 02/17/2015   Tramadol Nausea And Vomiting 02/17/2015    No family history on file.  Social History   Socioeconomic History   Marital status: Married    Spouse name: Not on file   Number of children: Not on file   Years of education: Not on file   Highest education level: Not on file  Occupational History   Not on file  Tobacco Use   Smoking status: Not on file   Smokeless tobacco: Not on file  Substance and Sexual Activity   Alcohol use: Not on file   Drug use: Not on file   Sexual activity: Not on file  Other Topics  Concern   Not on file  Social History Narrative   Not on file   Social Determinants of Health   Financial Resource Strain: Not on file  Food Insecurity: Not on file  Transportation Needs: Not on file  Physical Activity: Not on file  Stress: Not on file  Social Connections: Not on file  Intimate Partner Violence: Not on file    Review of Systems: See HPI, otherwise negative ROS  Physical Exam: There were no vitals taken for this visit. General:   Alert,  pleasant and cooperative in NAD Head:  Normocephalic and atraumatic. Neck:  Supple; no masses or thyromegaly. Lungs:  Clear  throughout to auscultation, normal respiratory effort.    Heart:  +S1, +S2, Regular rate and rhythm, No edema. Abdomen:  Soft, nontender and nondistended. Normal bowel sounds, without guarding, and without rebound.   Neurologic:  Alert and  oriented x4;  grossly normal neurologically.  Impression/Plan: Gina Arnold is here for an endoscopy and sigmoidoscopy for GERD and fecal incontinence    Risks, benefits, limitations, and alternatives regarding endoscopy have been reviewed with the patient.  Questions have been answered.  All parties agreeable.   Wyline Mood, MD  09/01/2022, 12:05 PM

## 2022-09-01 NOTE — Anesthesia Preprocedure Evaluation (Signed)
Anesthesia Evaluation  Patient identified by MRN, date of birth, ID band Patient awake    Reviewed: Allergy & Precautions, NPO status , Patient's Chart, lab work & pertinent test results  History of Anesthesia Complications Negative for: history of anesthetic complications  Airway Mallampati: I   Neck ROM: Full    Dental  (+) Missing   Pulmonary former smoker   Pulmonary exam normal breath sounds clear to auscultation       Cardiovascular hypertension, Normal cardiovascular exam Rhythm:Regular Rate:Normal  Echo 05/15/20:  NORMAL LEFT VENTRICULAR SYSTOLIC FUNCTION WITH AN ESTIMATED EF = >55 %  NORMAL RIGHT VENTRICULAR SYSTOLIC FUNCTION  MILD TRICUSPID VALVE INSUFFICIENCY  TRACE MITRAL AND AORTIC VALVE INSUFFICIENCY  NO VALVULAR STENOSIS     Neuro/Psych negative neurological ROS     GI/Hepatic ,GERD  ,,  Endo/Other  negative endocrine ROS    Renal/GU negative Renal ROS     Musculoskeletal   Abdominal   Peds  Hematology negative hematology ROS (+)   Anesthesia Other Findings   Reproductive/Obstetrics                             Anesthesia Physical Anesthesia Plan  ASA: 2  Anesthesia Plan: General   Post-op Pain Management:    Induction: Intravenous  PONV Risk Score and Plan: 3 and Propofol infusion, TIVA and Treatment may vary due to age or medical condition  Airway Management Planned: Natural Airway  Additional Equipment:   Intra-op Plan:   Post-operative Plan:   Informed Consent: I have reviewed the patients History and Physical, chart, labs and discussed the procedure including the risks, benefits and alternatives for the proposed anesthesia with the patient or authorized representative who has indicated his/her understanding and acceptance.       Plan Discussed with: CRNA  Anesthesia Plan Comments: (LMA/GETA backup discussed.  Patient consented for risks of  anesthesia including but not limited to:  - adverse reactions to medications - damage to eyes, teeth, lips or other oral mucosa - nerve damage due to positioning  - sore throat or hoarseness - damage to heart, brain, nerves, lungs, other parts of body or loss of life  Informed patient about role of CRNA in peri- and intra-operative care.  Patient voiced understanding.)       Anesthesia Quick Evaluation

## 2022-09-01 NOTE — Op Note (Signed)
[facility] Gastroenterology Patient Name: Gina Arnold Procedure Date: 09/01/2022 12:43 PM MRN: [id number] Account #: [id number] Date of Birth: 16-Dec-1945 Admit Type: Outpatient Age: 77 Room: [facility] ENDO ROOM 3 Gender: Female Note Status: Finalized Instrument Name: Upper Endoscope [id number] Procedure:             Flexible Sigmoidoscopy Indications:           Incontinence of feces Providers:             Wyline Mood MD, MD Referring MD:          Duane Lope. Judithann Sheen, MD (Referring MD) Medicines:             Monitored Anesthesia Care Complications:         No immediate complications. Procedure:             Pre-Anesthesia Assessment:                        - Prior to the procedure, a History and Physical was                         performed, and patient medications, allergies and                         sensitivities were reviewed. The patient's tolerance                         of previous anesthesia was reviewed.                        - The risks and benefits of the procedure and the                         sedation options and risks were discussed with the                         patient. All questions were answered and informed                         consent was obtained.                        - ASA Grade Assessment: II - A patient with mild                         systemic disease.                        After obtaining informed consent, the scope was passed                         under direct vision. The Endoscope was introduced                         through the anus and advanced to the the left                         transverse colon. The flexible sigmoidoscopy was  accomplished with ease. The patient tolerated the                         procedure well. The quality of the bowel preparation                         was adequate. Findings:      The perianal exam findings include mildly decreased anal tone      The colon (entire  examined portion) appeared normal. Impression:            - Abnormal perianal exam.                        - The entire examined colon is normal.                        - No specimens collected. Recommendation:        - Discharge patient to home (with escort).                        - Advance diet as tolerated.                        - Return to my office as previously scheduled. 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 All rights reserved. The codes documented in this report are preliminary and upon coder review may  be revised to meet current compliance requirements. Wyline Mood, MD Wyline Mood MD, MD 09/01/2022 1:15:36 PM This report has been signed electronically. Number of Addenda: 0 Note Initiated On: 09/01/2022 12:43 PM Total Procedure Duration: 0 hours 4 minutes 41 seconds  Estimated Blood Loss:  Estimated blood loss: none.      [facility]

## 2022-09-02 ENCOUNTER — Encounter: Payer: Self-pay | Admitting: Gastroenterology

## 2022-10-12 ENCOUNTER — Other Ambulatory Visit: Payer: Self-pay | Admitting: Gastroenterology

## 2022-11-08 ENCOUNTER — Ambulatory Visit: Payer: Medicare HMO | Admitting: Gastroenterology

## 2022-11-08 ENCOUNTER — Encounter: Payer: Self-pay | Admitting: Gastroenterology

## 2022-11-08 VITALS — BP 107/74 | HR 83 | Temp 97.7°F | Wt 165.0 lb

## 2022-11-08 DIAGNOSIS — R159 Full incontinence of feces: Secondary | ICD-10-CM

## 2022-11-08 DIAGNOSIS — K219 Gastro-esophageal reflux disease without esophagitis: Secondary | ICD-10-CM

## 2022-11-08 DIAGNOSIS — R1013 Epigastric pain: Secondary | ICD-10-CM | POA: Diagnosis not present

## 2022-11-08 DIAGNOSIS — R140 Abdominal distension (gaseous): Secondary | ICD-10-CM | POA: Diagnosis not present

## 2022-11-08 NOTE — Progress Notes (Signed)
Gina Mood MD, MRCP(U.K) [address]  [address]  [place], [place] [address]  Main: [phone]  Fax: [phone]   Primary Care Physician: Marguarite Arbour, MD  Primary Gastroenterologist:  Dr. Wyline Arnold   Chief Complaint  Patient presents with   Gastroesophageal Reflux    HPI: Gina Arnold is a 77 y.o. female Summary of history :   Initially referred and seen on 02/15/2022 for symptoms of indigestion.  She said that her symptoms are predominantly heartburn which she wakes up in the morning no issues with swallowing she has a dinner an hour or 2 before bedtime but lays flat right after her dinner not on any PPI.  She had gained weight recently.  No other complaints.  Denies any abdominal pain.   She also complains of recurrent UTIs and urinary and bladder incontinence going on for a while.  She recollects she was in prolonged labor during childbirth.  Her last colonoscopy was just a few years back and states she is not due for one yet. Despite a trial of vonoprazan continues to have reflux-like symptoms it has not helped.  She also pointed to have on and off fecal incontinence.   Interval history 08/10/2022-11/08/2022   09/01/2022: EGD: fundal polyps- bx taken hyperplastic, flex sig normal except decreased anal tone ,    Since last visit she states she denies any accidents but still has some difficulty maintaining continence of her feces.  Consumes sweet tea on a daily basis with artificial sugars which is causing her a lot of bloating and which may also be contributing to her incontinence.   Current Outpatient Medications  Medication Sig Dispense Refill   amLODipine (NORVASC) 5 MG tablet Take 1 tablet by mouth daily.     b complex vitamins capsule Take 1 capsule by mouth daily.     Calcium Carbonate 500 MG CHEW Chew by mouth.     estradiol (ESTRACE) 0.1 MG/GM vaginal cream Apply a pea-sized amount to fingertip and wipe in vaginal introitus twice weekly 42.5 g 0    imipramine (TOFRANIL) 25 MG tablet Take 1 tablet by mouth at bedtime.     magnesium oxide (MAG-OX) 400 MG tablet Take 400 mg by mouth daily.     melatonin 1 MG TABS tablet Take 1 mg by mouth at bedtime as needed.     nitrofurantoin, macrocrystal-monohydrate, (MACROBID) 100 MG capsule Take 100 mg by mouth 2 (two) times daily.     omeprazole (PRILOSEC) 20 MG capsule Take 20 mg by mouth 2 (two) times daily before a meal.     oxybutynin (DITROPAN) 5 MG tablet Take 5 mg by mouth 3 (three) times daily.     phentermine (ADIPEX-P) 37.5 MG tablet Take 37.5 mg by mouth daily before breakfast.     simvastatin (ZOCOR) 20 MG tablet Take 1 tablet by mouth at bedtime.     tirzepatide (ZEPBOUND) 5 MG/0.5ML Pen Inject 5 mg into the skin once a week.     topiramate (TOPAMAX) 50 MG tablet Take 50 mg by mouth daily.     trimethoprim (TRIMPEX) 100 MG tablet Take 1 tablet (100 mg total) by mouth daily. 30 tablet 2   UNABLE TO FIND Take by mouth.     Vonoprazan Fumarate (VOQUEZNA) 20 MG TABS Take 1 tablet by mouth daily. (Patient not taking: Reported on 11/08/2022)     No current facility-administered medications for this visit.    Allergies as of 11/08/2022 - Review Complete 11/08/2022  Allergen Reaction Noted   Hydrocodone-acetaminophen Itching and Other (See Comments) 10/16/2013   Sulfasalazine Nausea And Vomiting 05/31/2016   Ciprofloxacin Nausea And Vomiting and Nausea Only 08/02/2016   Codeine Itching 11/10/2013   Metronidazole Nausea And Vomiting and Nausea Only 08/02/2016   Sulfa antibiotics Other (See Comments) 02/17/2015   Tramadol Nausea And Vomiting 02/17/2015    ROS:  General: Negative for anorexia, weight loss, fever, chills, fatigue, weakness. ENT: Negative for hoarseness, difficulty swallowing , nasal congestion. CV: Negative for chest pain, angina, palpitations, dyspnea on exertion, peripheral edema.  Respiratory: Negative for dyspnea at rest, dyspnea on exertion, cough, sputum, wheezing.   GI: See history of present illness. GU:  Negative for dysuria, hematuria, urinary incontinence, urinary frequency, nocturnal urination.  Endo: Negative for unusual weight change.    Physical Examination:   BP (!) 152/78   Pulse 85   Temp 97.7 F (36.5 C) (Oral)   Wt 165 lb (74.8 kg)   BMI 29.70 kg/m   General: Well-nourished, well-developed in no acute distress.  Neuro: Alert and oriented x 3.  Grossly intact. Skin: Warm and dry, no jaundice.   Psych: Alert and cooperative, normal Arnold and affect.   Imaging Studies: No results found.  Assessment and Plan:   Gina Arnold is a 77 y.o. y/o female here to follow-up for dyspeptic symptoms possibly due to reflux.  Has failed PPI as well as vonoprazan.  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  Advised her on lifestyle changes keeping the head end of the bed elevated avoiding large meals before bedtime and having small meals towards the evening.  In terms of her gas and bloating which she has today low very likely due to fermentation of artificial sugars and sweet tea advised her to discontinue and consume unsweetened tea with added regular sugar.  If that fails trial of low FODMAP diet.  If that fails we can consider a course of Xifaxan.  She will also use charcoal tablets as needed advised to take it separate from her other medications.  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

## 2022-11-08 NOTE — Patient Instructions (Addendum)
[facility] will be calling you to schedule an appointment with you. Please be alert of your phone.  Please purchase some charcoal tablets at your local pharmacy.   Low-FODMAP Eating Plan  FODMAP stands for fermentable oligosaccharides, disaccharides, monosaccharides, and polyols. These are sugars that are hard for some people to digest. A low-FODMAP eating plan may help some people who have irritable bowel syndrome (IBS) and certain other bowel (intestinal) diseases to manage their symptoms. This meal plan can be complicated to follow. Work with a diet and nutrition specialist (dietitian) to make a low-FODMAP eating plan that is right for you. A dietitian can help make sure that you get enough nutrition from this diet. What are tips for following this plan? Reading food labels Check labels for hidden FODMAPs such as: High-fructose syrup. Honey. Agave. Natural fruit flavors. Onion or garlic powder. Choose low-FODMAP foods that contain 3-4 grams of fiber per serving. Check food labels for serving sizes. Eat only one serving at a time to make sure FODMAP levels stay low. Shopping Shop with a list of foods that are recommended on this diet and make a meal plan. Meal planning Follow a low-FODMAP eating plan for up to 6 weeks, or as told by your health care provider or dietitian. To follow the eating plan: Eliminate high-FODMAP foods from your diet completely. Choose only low-FODMAP foods to eat. You will do this for 2-6 weeks. Gradually reintroduce high-FODMAP foods into your diet one at a time. Most people should wait a few days before introducing the next new high-FODMAP food into their meal plan. Your dietitian can recommend how quickly you may reintroduce foods. Keep a daily record of what and how much you eat and drink. Make note of any symptoms that you have after eating. Review your daily record with a dietitian regularly to identify which foods you can eat and which foods you  should avoid. General tips Drink enough fluid each day to keep your urine pale yellow. Avoid processed foods. These often have added sugar and may be high in FODMAPs. Avoid most dairy products, whole grains, and sweeteners. Work with a dietitian to make sure you get enough fiber in your diet. Avoid high FODMAP foods at meals to manage symptoms. Recommended foods Fruits Bananas, oranges, tangerines, lemons, limes, blueberries, raspberries, strawberries, grapes, cantaloupe, honeydew melon, kiwi, papaya, passion fruit, and pineapple. Limited amounts of dried cranberries, banana chips, and shredded coconut. Vegetables Eggplant, zucchini, cucumber, peppers, green beans, bean sprouts, lettuce, arugula, kale, Swiss chard, spinach, collard greens, bok choy, summer squash, potato, and tomato. Limited amounts of corn, carrot, and sweet potato. Green parts of scallions. Grains Gluten-free grains, such as rice, oats, buckwheat, quinoa, corn, polenta, and millet. Gluten-free pasta, bread, or cereal. Rice noodles. Corn tortillas. Meats and other proteins Unseasoned beef, pork, poultry, or fish. Eggs. Tomasa Blase. Tofu (firm) and tempeh. Limited amounts of nuts and seeds, such as almonds, walnuts, [place] nuts, pecans, peanuts, nut butters, pumpkin seeds, chia seeds, and sunflower seeds. Dairy Lactose-free milk, yogurt, and kefir. Lactose-free cottage cheese and ice cream. Non-dairy milks, such as almond, coconut, hemp, and rice milk. Non-dairy yogurt. Limited amounts of goat cheese, brie, mozzarella, parmesan, swiss, and other hard cheeses. Fats and oils Butter-free spreads. Vegetable oils, such as olive, canola, and sunflower oil. Seasoning and other foods Artificial sweeteners with names that do not end in "ol," such as aspartame, saccharine, and stevia. Maple syrup, white table sugar, raw sugar, brown sugar, and molasses. Mayonnaise, soy sauce, and  tamari. Fresh basil, coriander, parsley, rosemary, and  thyme. Beverages Water and mineral water. Sugar-sweetened soft drinks. Small amounts of orange juice or cranberry juice. Black and green tea. Most dry wines. Coffee. The items listed above may not be a complete list of foods and beverages you can eat. Contact a dietitian for more information. Foods to avoid Fruits Fresh, dried, and juiced forms of apple, pear, watermelon, peach, plum, cherries, apricots, blackberries, boysenberries, figs, nectarines, and mango. Avocado. Vegetables Chicory root, artichoke, asparagus, cabbage, snow peas, Brussels sprouts, broccoli, sugar snap peas, mushrooms, celery, and cauliflower. Onions, garlic, leeks, and the white part of scallions. Grains Wheat, including kamut, durum, and semolina. Barley and bulgur. Couscous. Wheat-based cereals. Wheat noodles, bread, crackers, and pastries. Meats and other proteins Fried or fatty meat. Sausage. Cashews and pistachios. Soybeans, baked beans, black beans, chickpeas, kidney beans, fava beans, navy beans, lentils, black-eyed peas, and split peas. Dairy Milk, yogurt, ice cream, and soft cheese. Cream and sour cream. Milk-based sauces. Custard. Buttermilk. Soy milk. Seasoning and other foods Any sugar-free gum or candy. Foods that contain artificial sweeteners such as sorbitol, mannitol, isomalt, or xylitol. Foods that contain honey, high-fructose corn syrup, or agave. Bouillon, vegetable stock, beef stock, and chicken stock. Garlic and onion powder. Condiments made with onion, such as hummus, chutney, pickles, relish, salad dressing, and salsa. Tomato paste. Beverages Chicory-based drinks. Coffee substitutes. Chamomile tea. Fennel tea. Sweet or fortified wines such as port or sherry. Diet soft drinks made with isomalt, mannitol, maltitol, sorbitol, or xylitol. Apple, pear, and mango juice. Juices with high-fructose corn syrup. The items listed above may not be a complete list of foods and beverages you should avoid. Contact a  dietitian for more information. Summary FODMAP stands for fermentable oligosaccharides, disaccharides, monosaccharides, and polyols. These are sugars that are hard for some people to digest. A low-FODMAP eating plan is a short-term diet that helps to ease symptoms of certain bowel diseases. The eating plan usually lasts up to 6 weeks. After that, high-FODMAP foods are reintroduced gradually and one at a time. This can help you find out which foods may be causing symptoms. A low-FODMAP eating plan can be complicated. It is best to work with a dietitian who has experience with this type of plan. This information is not intended to replace advice given to you by your health care provider. Make sure you discuss any questions you have with your health care provider. Document Revised: 07/26/2019 Document Reviewed: 07/26/2019 Elsevier Patient Education  2024 [employer].

## 2022-12-13 ENCOUNTER — Other Ambulatory Visit: Payer: Self-pay | Admitting: Gastroenterology

## 2022-12-15 ENCOUNTER — Other Ambulatory Visit: Payer: Self-pay | Admitting: Gastroenterology

## 2022-12-16 ENCOUNTER — Telehealth: Payer: Self-pay | Admitting: Gastroenterology

## 2022-12-16 NOTE — Telephone Encounter (Signed)
Called patient back and she stated that she remembered that it was a follow up appointment that she had scheduled 6 months ago and that it was not Dr. Tobi Bastos that had referred her. Patient thanked me for returning her call.

## 2022-12-16 NOTE — Telephone Encounter (Signed)
Patient called in and left a voicemail stating if Dr.Anna had referral her to an urology because she has an appointment schedule on 12/23/22 at 9:15 am. I have checked the referral que to see if there was an referral sent out. She would like to speak to the nurse about a referral.

## 2022-12-23 ENCOUNTER — Encounter: Payer: Self-pay | Admitting: Urology

## 2022-12-23 ENCOUNTER — Ambulatory Visit: Payer: Medicare HMO | Admitting: Urology

## 2022-12-23 VITALS — BP 146/76 | HR 90 | Ht 63.0 in | Wt 165.0 lb

## 2022-12-23 DIAGNOSIS — Z87440 Personal history of urinary (tract) infections: Secondary | ICD-10-CM

## 2022-12-23 DIAGNOSIS — Z09 Encounter for follow-up examination after completed treatment for conditions other than malignant neoplasm: Secondary | ICD-10-CM | POA: Diagnosis not present

## 2022-12-23 DIAGNOSIS — N390 Urinary tract infection, site not specified: Secondary | ICD-10-CM

## 2022-12-23 MED ORDER — ESTRADIOL 0.1 MG/GM VA CREA: TOPICAL_CREAM | VAGINAL | 0 refills | Status: DC

## 2022-12-23 NOTE — Progress Notes (Signed)
I, Gina Arnold, acting as a scribe for Gina Altes, MD., have documented all relevant documentation on the behalf of Gina Altes, MD, as directed by Gina Altes, MD while in the presence of Gina Altes, MD.  12/23/2022 10:34 AM   Gina Arnold 06/22/1945 [id number]  Referring provider: Marguarite Arbour, MD [address] [facility] [place],  [place] [address]  Chief Complaint  Patient presents with   Recurrent UTI   Urologic history: 1. Recurrent UTI Initially seen December 2023 and was placed on a 3 month course of low-dose antibiotic prophylaxis, Estrace, and D-mannose.  3 month follow-up, low-dose antibiotic prophylaxis was discontinued.   HPI: Gina Arnold is a 77 y.o. female presents for follow-up for recurrent UTI.  No problems since her last visit in April 2024 and has not had recurrent UTI since that time.  Remains on vaginal estrogen cream and D-mannose. She was unable to give a urine specimen today, but is asymptomatic.    PMH: Past Medical History:  Diagnosis Date   GERD (gastroesophageal reflux disease)    Hypertension     Surgical History: Past Surgical History:  Procedure Laterality Date   ABDOMINAL HYSTERECTOMY     APPENDECTOMY     CHOLECYSTECTOMY     ESOPHAGOGASTRODUODENOSCOPY (EGD) WITH PROPOFOL N/A 09/01/2022   Procedure: ESOPHAGOGASTRODUODENOSCOPY (EGD) WITH PROPOFOL;  Surgeon: Wyline Mood, MD;  [facility] ENDOSCOPY;  Service: Gastroenterology;  Laterality: N/A;   EYE SURGERY     FLEXIBLE SIGMOIDOSCOPY N/A 09/01/2022   Procedure: FLEXIBLE SIGMOIDOSCOPY;  Surgeon: Wyline Mood, MD;  Location: [facility] ENDOSCOPY;  Service: Gastroenterology;  Laterality: N/A;   TONSILLECTOMY      Home Medications:  Allergies as of 12/23/2022       Reactions   Hydrocodone-acetaminophen Itching, Other (See Comments)   unsure   Sulfasalazine Nausea And Vomiting   Decreases WBC   Ciprofloxacin Nausea And Vomiting, Nausea Only    Exhaustion and indigestion   Codeine Itching   Metronidazole Nausea And Vomiting, Nausea Only   Exhaustion and indigestion   Sulfa Antibiotics Other (See Comments)   Decreases WBC Decreased WBC Decreased wbcs   Tramadol Nausea And Vomiting   Other Reaction(s): Other (See Comments) GI Upset        Medication List        Accurate as of December 23, 2022 10:34 AM. If you have any questions, ask your nurse or doctor.          STOP taking these medications    trimethoprim 100 MG tablet Commonly known as: TRIMPEX Stopped by: Gina Arnold       TAKE these medications    amLODipine 5 MG tablet Commonly known as: NORVASC Take 1 tablet by mouth daily.   b complex vitamins capsule Take 1 capsule by mouth daily.   Calcium Carbonate 500 MG Chew Chew by mouth.   estradiol 0.1 MG/GM vaginal cream Commonly known as: ESTRACE Apply a pea-sized amount to fingertip or Q-tip and wipe vaginal roof twice weekly What changed: additional instructions Changed by: Gina Arnold   imipramine 25 MG tablet Commonly known as: TOFRANIL Take 1 tablet by mouth at bedtime.   magnesium oxide 400 MG tablet Commonly known as: MAG-OX Take 400 mg by mouth daily.   melatonin 1 MG Tabs tablet Take 1 mg by mouth at bedtime as needed.   nitrofurantoin (macrocrystal-monohydrate) 100 MG capsule Commonly known as: MACROBID Take 100 mg by mouth  2 (two) times daily.   omeprazole 20 MG capsule Commonly known as: PRILOSEC TAKE 1 CAPSULE BY MOUTH TWICE A DAY BEFORE A MEAL   oxybutynin 5 MG tablet Commonly known as: DITROPAN Take 5 mg by mouth 3 (three) times daily.   phentermine 37.5 MG tablet Commonly known as: ADIPEX-P Take 37.5 mg by mouth daily before breakfast.   simvastatin 20 MG tablet Commonly known as: ZOCOR Take 1 tablet by mouth at bedtime.   topiramate 50 MG tablet Commonly known as: TOPAMAX Take 50 mg by mouth daily.   UNABLE TO FIND Take by mouth.   Voquezna  20 MG Tabs Generic drug: Vonoprazan Fumarate Take 1 tablet by mouth daily.   Zepbound 5 MG/0.5ML Pen Generic drug: tirzepatide Inject 5 mg into the skin once a week.        Allergies:  Allergies  Allergen Reactions   Hydrocodone-Acetaminophen Itching and Other (See Comments)    unsure   Sulfasalazine Nausea And Vomiting    Decreases WBC   Ciprofloxacin Nausea And Vomiting and Nausea Only    Exhaustion and indigestion   Codeine Itching   Metronidazole Nausea And Vomiting and Nausea Only    Exhaustion and indigestion   Sulfa Antibiotics Other (See Comments)    Decreases WBC  Decreased WBC  Decreased wbcs   Tramadol Nausea And Vomiting    Other Reaction(s): Other (See Comments)  GI Upset    Social History:  reports that she has quit smoking. Her smoking use included cigarettes. She has never used smokeless tobacco. She reports that she does not drink alcohol and does not use drugs.   Physical Exam: BP (!) 146/76   Pulse 90   Ht 5\' 3"  (1.6 m)   Wt 165 lb (74.8 kg)   BMI 29.23 kg/m   Constitutional:  Alert and oriented, No acute distress. HEENT: [place] AT Respiratory: Normal respiratory effort, no increased work of breathing. Psychiatric: Normal mood and affect.   Assessment & Plan:    1. Recurrent UTI Doing well on D-mannose and Estrace.  Estrace refilled.  1 year follow-up and instructed to call earlier for recurrent UTI symptoms.    I have reviewed the above documentation for accuracy and completeness, and I agree with the above.   Gina Altes, MD  [facility] [address], [address] [place], [place] [address] [phone]

## 2023-01-21 ENCOUNTER — Ambulatory Visit: Payer: Medicare HMO | Admitting: Physician Assistant

## 2023-01-21 VITALS — BP 136/73 | HR 83 | Ht 63.0 in | Wt 165.1 lb

## 2023-01-21 DIAGNOSIS — R300 Dysuria: Secondary | ICD-10-CM

## 2023-01-21 LAB — MICROSCOPIC EXAMINATION: WBC, UA: >30 /[HPF] — AB (ref 0–5)

## 2023-01-21 LAB — URINALYSIS, COMPLETE
Bilirubin, UA: NEGATIVE
Glucose, UA: NEGATIVE
Ketones, UA: NEGATIVE
Nitrite, UA: NEGATIVE
Protein,UA: NEGATIVE
RBC, UA: NEGATIVE
Specific Gravity, UA: 1.015 (ref 1.005–1.030)
Urobilinogen, Ur: 0.2 mg/dL (ref 0.2–1.0)
pH, UA: 7 (ref 5.0–7.5)

## 2023-01-21 LAB — BLADDER SCAN AMB NON-IMAGING: Scan Result: 139

## 2023-01-21 MED ORDER — CEFUROXIME AXETIL 250 MG PO TABS
250.0000 mg | ORAL_TABLET | Freq: Two times a day (BID) | ORAL | 0 refills | Status: AC
Start: 2023-01-21 — End: 2023-01-26

## 2023-01-21 NOTE — Progress Notes (Signed)
01/21/2023 10:33 AM   [name] 1945-12-21 [id number]  CC: Chief Complaint  Patient presents with   Urinary Urgency   HPI: Gina Arnold is a 77 y.o. 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   Today she reports a 1 day history of dysuria, frequency, urgency, and low back pain.  She denies fever, chills, nausea, or vomiting.  She continues to use estrogen cream and d-mannose.  In-office UA today positive for 1+ leukocytes; urine microscopy with >30 WBCs/HPF and many bacteria. PVR .  PMH: Past Medical History:  Diagnosis Date   GERD (gastroesophageal reflux disease)    Hypertension     Surgical History: Past Surgical History:  Procedure Laterality Date   ABDOMINAL HYSTERECTOMY     APPENDECTOMY     CHOLECYSTECTOMY     ESOPHAGOGASTRODUODENOSCOPY (EGD) WITH PROPOFOL N/A 09/01/2022   Procedure: ESOPHAGOGASTRODUODENOSCOPY (EGD) WITH PROPOFOL;  Surgeon: Wyline Mood, MD;  Location: [facility] ENDOSCOPY;  Service: Gastroenterology;  Laterality: N/A;   EYE SURGERY     FLEXIBLE SIGMOIDOSCOPY N/A 09/01/2022   Procedure: FLEXIBLE SIGMOIDOSCOPY;  Surgeon: Wyline Mood, MD;  Location: [facility] ENDOSCOPY;  Service: Gastroenterology;  Laterality: N/A;   TONSILLECTOMY      Home Medications:  Allergies as of 01/21/2023       Reactions   Hydrocodone-acetaminophen Itching, Other (See Comments)   unsure   Sulfasalazine Nausea And Vomiting   Decreases WBC   Ciprofloxacin Nausea And Vomiting, Nausea Only   Exhaustion and indigestion   Codeine Itching   Metronidazole Nausea And Vomiting, Nausea Only   Exhaustion and indigestion   Sulfa Antibiotics Other (See Comments)   Decreases WBC Decreased WBC Decreased wbcs   Tramadol Nausea And Vomiting   Other Reaction(s): Other (See Comments) GI Upset        Medication List        Accurate as of January 21, 2023 10:33  AM. If you have any questions, ask your nurse or doctor.          STOP taking these medications    nitrofurantoin (macrocrystal-monohydrate) 100 MG capsule Commonly known as: MACROBID       TAKE these medications    amLODipine 5 MG tablet Commonly known as: NORVASC Take 1 tablet by mouth daily.   b complex vitamins capsule Take 1 capsule by mouth daily.   Calcium Carbonate 500 MG Chew Chew by mouth.   cefUROXime 250 MG tablet Commonly known as: CEFTIN Take 1 tablet (250 mg total) by mouth 2 (two) times daily with a meal for 5 days.   estradiol 0.1 MG/GM vaginal cream Commonly known as: ESTRACE Apply a pea-sized amount to fingertip or Q-tip and wipe vaginal roof twice weekly   imipramine 25 MG tablet Commonly known as: TOFRANIL Take 1 tablet by mouth at bedtime.   magnesium oxide 400 MG tablet Commonly known as: MAG-OX Take 400 mg by mouth daily.   melatonin 1 MG Tabs tablet Take 1 mg by mouth at bedtime as needed.   omeprazole 20 MG capsule Commonly known as: PRILOSEC TAKE 1 CAPSULE BY MOUTH TWICE A DAY BEFORE A MEAL   oxybutynin 5 MG tablet Commonly known as: DITROPAN Take 5 mg by mouth 3 (three) times daily.   phentermine 37.5 MG tablet Commonly known as: ADIPEX-P Take 37.5 mg by mouth daily before breakfast.   simvastatin 20 MG tablet Commonly known as: ZOCOR Take 1  tablet by mouth at bedtime.   topiramate 50 MG tablet Commonly known as: TOPAMAX Take 50 mg by mouth daily.   UNABLE TO FIND Take by mouth.   Voquezna 20 MG Tabs Generic drug: Vonoprazan Fumarate Take 1 tablet by mouth daily.   Zepbound 5 MG/0.5ML Pen Generic drug: tirzepatide Inject 5 mg into the skin once a week.        Allergies:  Allergies  Allergen Reactions   Hydrocodone-Acetaminophen Itching and Other (See Comments)    unsure   Sulfasalazine Nausea And Vomiting    Decreases WBC   Ciprofloxacin Nausea And Vomiting and Nausea Only    Exhaustion and indigestion    Codeine Itching   Metronidazole Nausea And Vomiting and Nausea Only    Exhaustion and indigestion   Sulfa Antibiotics Other (See Comments)    Decreases WBC  Decreased WBC  Decreased wbcs   Tramadol Nausea And Vomiting    Other Reaction(s): Other (See Comments)  GI Upset    Family History: No family history on file.  Social History:   reports that she has quit smoking. Her smoking use included cigarettes. She has never used smokeless tobacco. She reports that she does not drink alcohol and does not use drugs.  Physical Exam: BP 136/73   Pulse 83   Ht 5\' 3"  (1.6 m)   Wt 165 lb 2 oz (74.9 kg)   BMI 29.25 kg/m   Constitutional:  Alert and oriented, no acute distress, nontoxic appearing HEENT: [place], AT Cardiovascular: No clubbing, cyanosis, or edema Respiratory: Normal respiratory effort, no increased work of breathing Skin: No rashes, bruises or suspicious lesions Neurologic: Grossly intact, no focal deficits, moving all 4 extremities Psychiatric: Normal mood and affect  Laboratory Data: Results for orders placed or performed in visit on 01/21/23  Microscopic Examination   Urine  Result Value Ref Range   WBC, UA >30 (A) 0 - 5 /hpf   RBC, Urine 0-2 0 - 2 /hpf   Epithelial Cells (non renal) 0-10 0 - 10 /hpf   Bacteria, UA Many (A) None seen/Few  Urinalysis, Complete  Result Value Ref Range   Specific Gravity, UA 1.015 1.005 - 1.030   pH, UA 7.0 5.0 - 7.5   Color, UA Yellow Yellow   Appearance Ur Clear Clear   Leukocytes,UA 1+ (A) Negative   Protein,UA Negative Negative/Trace   Glucose, UA Negative Negative   Ketones, UA Negative Negative   RBC, UA Negative Negative   Bilirubin, UA Negative Negative   Urobilinogen, Ur 0.2 0.2 - 1.0 mg/dL   Nitrite, UA Negative Negative   Microscopic Examination See below:   Bladder Scan (Post Void Residual) in office  Result Value Ref Range   Scan Result 139 ml    Assessment & Plan:   1. Dysuria UA appears grossly infected,  will start empiric cefuroxime and send for culture for further evaluation.  She is emptying appropriately.  If her UTIs become more persistent, may consider resuming suppressive therapy. - Urinalysis, Complete - Bladder Scan (Post Void Residual) in office - CULTURE, URINE COMPREHENSIVE - cefUROXime (CEFTIN) 250 MG tablet; Take 1 tablet (250 mg total) by mouth 2 (two) times daily with a meal for 5 days.  Dispense: 10 tablet; Refill: 0  Return if symptoms worsen or fail to improve.  Carman Ching, PA-C  [facility] [place] [address], [address] [place], [place] [address] [phone]

## 2023-01-25 LAB — CULTURE, URINE COMPREHENSIVE

## 2023-01-26 ENCOUNTER — Other Ambulatory Visit: Payer: Self-pay

## 2023-01-26 DIAGNOSIS — N390 Urinary tract infection, site not specified: Secondary | ICD-10-CM

## 2023-01-27 ENCOUNTER — Other Ambulatory Visit: Payer: Medicare HMO

## 2023-01-27 DIAGNOSIS — N390 Urinary tract infection, site not specified: Secondary | ICD-10-CM

## 2023-01-27 LAB — URINALYSIS, COMPLETE
Bilirubin, UA: NEGATIVE
Glucose, UA: NEGATIVE
Ketones, UA: NEGATIVE
Leukocytes,UA: NEGATIVE
Nitrite, UA: NEGATIVE
Protein,UA: NEGATIVE
RBC, UA: NEGATIVE
Specific Gravity, UA: 1.015 (ref 1.005–1.030)
Urobilinogen, Ur: 0.2 mg/dL (ref 0.2–1.0)
pH, UA: 7 (ref 5.0–7.5)

## 2023-01-27 LAB — MICROSCOPIC EXAMINATION: Epithelial Cells (non renal): >10 /[HPF] — AB (ref 0–10)

## 2023-02-08 ENCOUNTER — Other Ambulatory Visit: Payer: Self-pay

## 2023-02-08 ENCOUNTER — Encounter: Payer: Self-pay | Admitting: Emergency Medicine

## 2023-02-08 ENCOUNTER — Emergency Department
Admission: EM | Admit: 2023-02-08 | Discharge: 2023-02-08 | Disposition: A | Discharge status: Home / Self Care | Payer: Medicare HMO | Attending: Emergency Medicine | Admitting: Emergency Medicine

## 2023-02-08 ENCOUNTER — Emergency Department: Payer: Medicare HMO

## 2023-02-08 DIAGNOSIS — W19XXXA Unspecified fall, initial encounter: Secondary | ICD-10-CM

## 2023-02-08 DIAGNOSIS — I10 Essential (primary) hypertension: Secondary | ICD-10-CM | POA: Insufficient documentation

## 2023-02-08 DIAGNOSIS — W11XXXA Fall on and from ladder, initial encounter: Secondary | ICD-10-CM | POA: Insufficient documentation

## 2023-02-08 DIAGNOSIS — Y92009 Unspecified place in unspecified non-institutional (private) residence as the place of occurrence of the external cause: Secondary | ICD-10-CM | POA: Diagnosis not present

## 2023-02-08 DIAGNOSIS — S0003XA Contusion of scalp, initial encounter: Secondary | ICD-10-CM | POA: Diagnosis not present

## 2023-02-08 DIAGNOSIS — S0990XA Unspecified injury of head, initial encounter: Secondary | ICD-10-CM | POA: Diagnosis present

## 2023-02-08 NOTE — ED Provider Notes (Signed)
[facility] Emergency Department Provider Note     Event Date/Time   First MD Initiated Contact with Patient 02/08/23 1430     (approximate)   History   Fall   HPI  Gina Arnold is a 77 y.o. female with a history of HTN and GERD presents to the ED following a fall.  Patient reports she was on a ladder cutting her trees at home when she lost her balance and fell backwards onto her head. Denies LOC, vision changes and vomiting. She fell from a height of approximately 4 feet.  Denies chest pain or shortness of breath.  Noticeable abrasion on posterior scalp.     Physical Exam   Triage Vital Signs: ED Triage Vitals [02/08/23 1216]  Encounter Vitals Group     BP 135/70     Systolic BP Percentile      Diastolic BP Percentile      Pulse Rate 92     Resp 18     Temp 98.5 F (36.9 C)     Temp Source Oral     SpO2 96 %     Weight 165 lb (74.8 kg)     Height 5\' 3"  (1.6 m)     Head Circumference      Peak Flow      Pain Score 8     Pain Loc      Pain Education      Exclude from Growth Chart     Most recent vital signs: Vitals:   02/08/23 1216  BP: 135/70  Pulse: 92  Resp: 18  Temp: 98.5 F (36.9 C)  SpO2: 96%    General: Well appearing. Alert and oriented. INAD.  Skin:  Warm, dry and intact. No rashes or lesions noted.     Head:  NCAT.  Abrasion on posterior scalp with small scalp hematoma.  No lacerations. Eyes:  PERRLA. EOMI.  Neck:   No cervical spine tenderness to palpation. Full ROM without difficulty.  CV:  Good peripheral perfusion. RRR. No peripheral edema.  RESP:  Normal effort. LCTAB. No retractions.  ABD:  No distention. Soft, Non tender. Marland Kitchen  BACK:  Spinous process is midline without deformity or tenderness. MSK:   Full ROM in all joints. No swelling, deformity or tenderness.  NEURO: Cranial nerves II-XII intact. No focal deficits. Sensation and motor function intact. 5/5 muscle strength of UE & LE. Gait is steady.   ED  Results / Procedures / Treatments   Labs (all labs ordered are listed, but only abnormal results are displayed) Labs Reviewed - No data to display  RADIOLOGY  I personally viewed and evaluated these images as part of my medical decision making, as well as reviewing the written report by the radiologist.  ED Provider Interpretation: CT head and cervical spine appear normal.  CT Head Wo Contrast  Result Date: 02/08/2023 CLINICAL DATA:  Head trauma, coagulopathy (Age 44-64y); Neck trauma (Age >= 65y). Fall. EXAM: CT HEAD WITHOUT CONTRAST CT CERVICAL SPINE WITHOUT CONTRAST TECHNIQUE: Multidetector CT imaging of the head and cervical spine was performed following the standard protocol without intravenous contrast. Multiplanar CT image reconstructions of the cervical spine were also generated. RADIATION DOSE REDUCTION: This exam was performed according to the departmental dose-optimization program which includes automated exposure control, adjustment of the mA and/or kV according to patient size and/or use of iterative reconstruction technique. COMPARISON:  None Available. FINDINGS: CT HEAD FINDINGS Brain: No acute hemorrhage. Cortical gray-white differentiation  is preserved. Patchy hypoattenuation of the cerebral white matter, most consistent with mild chronic small-vessel disease. Prominence of the ventricles and sulci within normal limits for age. No extra-axial collection. Basilar cisterns are patent. Vascular: No hyperdense vessel or unexpected calcification. Skull: No calvarial fracture or suspicious bone lesion. Skull base is unremarkable. Sinuses/Orbits: No acute finding. Other: None. CT CERVICAL SPINE FINDINGS Alignment: Normal. Skull base and vertebrae: No acute fracture. Normal craniocervical junction. No suspicious bone lesions. Soft tissues and spinal canal: No prevertebral fluid or swelling. No visible canal hematoma. Disc levels: Mild cervical spondylosis without high-grade spinal canal  stenosis. Upper chest: No acute findings. Other: None. IMPRESSION: 1. No acute intracranial abnormality. 2. No acute cervical spine fracture or traumatic listhesis. 3. Mild cervical spondylosis without high-grade spinal canal stenosis. Electronically Signed   By: Orvan Falconer M.D.   On: 02/08/2023 14:49   CT Cervical Spine Wo Contrast  Result Date: 02/08/2023 CLINICAL DATA:  Head trauma, coagulopathy (Age 68-64y); Neck trauma (Age >= 65y). Fall. EXAM: CT HEAD WITHOUT CONTRAST CT CERVICAL SPINE WITHOUT CONTRAST TECHNIQUE: Multidetector CT imaging of the head and cervical spine was performed following the standard protocol without intravenous contrast. Multiplanar CT image reconstructions of the cervical spine were also generated. RADIATION DOSE REDUCTION: This exam was performed according to the departmental dose-optimization program which includes automated exposure control, adjustment of the mA and/or kV according to patient size and/or use of iterative reconstruction technique. COMPARISON:  None Available. FINDINGS: CT HEAD FINDINGS Brain: No acute hemorrhage. Cortical gray-white differentiation is preserved. Patchy hypoattenuation of the cerebral white matter, most consistent with mild chronic small-vessel disease. Prominence of the ventricles and sulci within normal limits for age. No extra-axial collection. Basilar cisterns are patent. Vascular: No hyperdense vessel or unexpected calcification. Skull: No calvarial fracture or suspicious bone lesion. Skull base is unremarkable. Sinuses/Orbits: No acute finding. Other: None. CT CERVICAL SPINE FINDINGS Alignment: Normal. Skull base and vertebrae: No acute fracture. Normal craniocervical junction. No suspicious bone lesions. Soft tissues and spinal canal: No prevertebral fluid or swelling. No visible canal hematoma. Disc levels: Mild cervical spondylosis without high-grade spinal canal stenosis. Upper chest: No acute findings. Other: None. IMPRESSION: 1. No  acute intracranial abnormality. 2. No acute cervical spine fracture or traumatic listhesis. 3. Mild cervical spondylosis without high-grade spinal canal stenosis. Electronically Signed   By: Orvan Falconer M.D.   On: 02/08/2023 14:49    PROCEDURES:  Critical Care performed: No  Procedures  MEDICATIONS ORDERED IN ED: Medications - No data to display   IMPRESSION / MDM / ASSESSMENT AND PLAN / ED COURSE  I reviewed the triage vital signs and the nursing notes.                              Clinical Course as of 02/08/23 1725  Tue Feb 08, 2023  1442 Declines pain management in the ED. [MH]    Clinical Course User Index [MH] Kern Reap A, PA-C    77 y.o. female presents to the emergency department for evaluation and treatment of fall and head pain. See HPI for further details.   Differential diagnosis includes, but is not limited to ICH, Skull fracture, headache, contusion   Patient's presentation is most consistent with acute complicated illness / injury requiring diagnostic workup.  Patient is alert and oriented.  She is hemodynamically stable.  Physical exam findings are reassuring stated above.  CT head and cervical spine are  reassuring.  No laceration noted.  Patient declined pain management in the ED and stated she would take medication at home.  Patient is in stable and satisfactory condition for discharge home. Encouraged to follow up with her primary care for further management. ED precautions discussed. All questions and concerns were addressed during this ED visit.     FINAL CLINICAL IMPRESSION(S) / ED DIAGNOSES   Final diagnoses:  Fall, initial encounter  Contusion of scalp, initial encounter    Rx / DC Orders   ED Discharge Orders     None        Note:  This document was prepared using Dragon voice recognition software and may include unintentional dictation errors.    Romeo Apple, Keenen Roessner A, PA-C 02/08/23 1725    Janith Lima, MD 02/09/23 1500

## 2023-02-08 NOTE — Discharge Instructions (Addendum)
You were evaluated in the ED today following a fall.  Your head CT and cervical spine CT are normal.   Apply ice over affected area. Take tylenol and ibuprofen for pain as needed. Monitor for symptoms such as severe headache, vomiting and visual changes. Return to ED if you experience any of these symptoms. Follow up with your primary care in 1 week.

## 2023-02-08 NOTE — ED Triage Notes (Signed)
Patient to ED via POV for a fall. States she fell off a ladder while trimming her tree. Was approx 4 feet off the ground hitting the back of her head. Denies LOC or blood thinners. Aox4 C/o of pain in the back of head.

## 2023-02-08 NOTE — ED Notes (Signed)
See triage note  Presents s/p fall  States she fell from ladder while trimming her tree  States she fell approx 4 ft  Hit her head  No LOC

## 2023-09-10 ENCOUNTER — Emergency Department

## 2023-09-10 ENCOUNTER — Emergency Department: Admission: EM | Admit: 2023-09-10 | Discharge: 2023-09-10 | Disposition: A | Discharge status: Home / Self Care

## 2023-09-10 ENCOUNTER — Other Ambulatory Visit: Payer: Self-pay

## 2023-09-10 DIAGNOSIS — S022XXA Fracture of nasal bones, initial encounter for closed fracture: Secondary | ICD-10-CM | POA: Diagnosis not present

## 2023-09-10 DIAGNOSIS — W010XXA Fall on same level from slipping, tripping and stumbling without subsequent striking against object, initial encounter: Secondary | ICD-10-CM | POA: Diagnosis not present

## 2023-09-10 DIAGNOSIS — S0992XA Unspecified injury of nose, initial encounter: Secondary | ICD-10-CM | POA: Diagnosis present

## 2023-09-10 DIAGNOSIS — I10 Essential (primary) hypertension: Secondary | ICD-10-CM | POA: Insufficient documentation

## 2023-09-10 DIAGNOSIS — Y92009 Unspecified place in unspecified non-institutional (private) residence as the place of occurrence of the external cause: Secondary | ICD-10-CM | POA: Insufficient documentation

## 2023-09-10 DIAGNOSIS — S0083XA Contusion of other part of head, initial encounter: Secondary | ICD-10-CM | POA: Insufficient documentation

## 2023-09-10 MED ORDER — BACITRACIN ZINC 500 UNIT/GM EX OINT
TOPICAL_OINTMENT | Freq: Once | CUTANEOUS | Status: AC
  Administered 2023-09-10: 1 via TOPICAL
  Filled 2023-09-10: qty 0.9

## 2023-09-10 NOTE — ED Notes (Signed)
 Pt verbalizes understanding of discharge instructions.

## 2023-09-10 NOTE — ED Triage Notes (Signed)
 To ED from [facility] to rule out orbital fx and nasal fx from mechanical fall yesterday. Pt tripped over a planter yesterday and fell onto face on cement. Pt has bruising under both eyes, above upper lip. To chin, and abrasions on nose and chin. Pt denies blood thinners and LOC. Steady gait. Denies pain. Just got tetanus shot at [facility] today.

## 2023-09-10 NOTE — Discharge Instructions (Addendum)
 Your exam and CT scans are normal and reassuring at this time.  No evidence of a serious head injury.  You do have evidence of minimally displaced nose fracture.  No intervention needed at this time.  You can expect several weeks of ongoing swelling before all the bruising resolves.  You should avoid blowing your nose for the next 48 hours.  Use OTC saline mist or saline gel to help with dried blood in the nose and to moisten mucous membranes.  Follow-up with your primary provider or [facility] for any ongoing issues with breathing through your nose.

## 2023-09-10 NOTE — ED Provider Notes (Signed)
 [facility] Emergency Department Provider Note     Event Date/Time   First MD Initiated Contact with Patient 09/10/23 1201     (approximate)   History   Fall and Facial Injury   HPI  Gina Arnold is a 78 y.o. female with a history of HTN, GERD, HLD, and obesity, presents to the ED from [facility].  Patient sustained a mechanical fall yesterday at home, tripping over a planter, and falling onto her face on cement.  She denies any LOC, weakness, or vision change.  Patient denies any blood thinner use.  She presents with bruising around both eyes and over her upper lip.  Abrasions noted to the central face.  Patient had a tetanus booster at [facility] prior to arrival.  Physical Exam   Triage Vital Signs: ED Triage Vitals  Encounter Vitals Group     BP --      Girls Systolic BP Percentile --      Girls Diastolic BP Percentile --      Boys Systolic BP Percentile --      Boys Diastolic BP Percentile --      Pulse --      Resp --      Temp --      Temp src --      SpO2 --      Weight 09/10/23 1158 160 lb (72.6 kg)     Height 09/10/23 1158 5' 3 (1.6 m)     Head Circumference --      Peak Flow --      Pain Score 09/10/23 1157 0     Pain Loc --      Pain Education --      Exclude from Growth Chart --     Most recent vital signs: Vitals:   09/10/23 1311  BP: (!) 146/74  Pulse: 79  Resp: 16  Temp: 98.2 F (36.8 C)  SpO2: 100%    General Awake, no distress. NAD HEENT NCAT, except for infraorbital ecchymosis bilaterally. PERRL. EOMI. no scleral icterus or conjunctival hemorrhage noted.  No rhinorrhea/epistaxis.  Dried blood noted in bilateral naris.  No gross nasal deformity noted but soft tissue swelling of the nasal bridge appreciated.  No septal hematoma noted.  Mucous membranes are moist.  No dental injury appreciated.  Lower lip with a superficial abrasion noted.  No hemotympanum.  CV:  Good peripheral perfusion. RRR RESP:  Normal effort.  CTA ABD:  No distention.  MSK:  Normal spinal alignment without midline tenderness, spasm, deformity, or step-off.  AROM of all extremities. NEURO: Cranial nerves II to XII grossly intact. SKIN:  Scabbed abrasions noted to the central face including the nose, upper lip at the philtrum, and chin  ED Results / Procedures / Treatments   Labs (all labs ordered are listed, but only abnormal results are displayed) Labs Reviewed - No data to display   EKG    RADIOLOGY  I personally viewed and evaluated these images as part of my medical decision making, as well as reviewing the written report by the radiologist.  ED Provider Interpretation: Acute mildly displaced left right nasal bone fractures  CT Maxillofacial Wo Contrast Result Date: 09/10/2023 CLINICAL DATA:  Head trauma, neck trauma. Mechanical fall yesterday. EXAM: CT HEAD WITHOUT CONTRAST CT MAXILLOFACIAL WITHOUT CONTRAST CT CERVICAL SPINE WITHOUT CONTRAST TECHNIQUE: Multidetector CT imaging of the head, cervical spine, and maxillofacial structures were performed using the standard protocol without intravenous contrast. Multiplanar CT  image reconstructions of the cervical spine and maxillofacial structures were also generated. RADIATION DOSE REDUCTION: This exam was performed according to the departmental dose-optimization program which includes automated exposure control, adjustment of the mA and/or kV according to patient size and/or use of iterative reconstruction technique. COMPARISON:  CT head and cervical spine 02/08/2023. FINDINGS: CT HEAD FINDINGS Brain: No acute intracranial hemorrhage. No CT evidence of acute infarct. Nonspecific hypoattenuation in the periventricular and subcortical white matter favored to reflect chronic microvascular ischemic changes. Similar appearance of 1.5 cm hyperattenuating extra-axial mass over the anterior left frontal lobe. No edema, mass effect, or midline shift. The basilar cisterns are patent.  Ventricles: The ventricles are normal. Vascular: No hyperdense vessel or unexpected calcification. Skull: No acute or aggressive finding. Other: Mastoid air cells are clear. CT MAXILLOFACIAL FINDINGS Osseous: Irregularity of the right nasal bone with slight medial displacement and lateral apex angulation. Additional mild irregularity of the left nasal bone. There is narrowing of the nasal vestibule on the right. Maxillofacial bones are otherwise intact. No mandibular dislocation. No destructive osseous lesion. Orbits: Globes are intact. Bilateral lens replacement. Extraocular muscles and optic nerve sheath complexes are unremarkable. Normal appearance of the orbital fat. Sinuses: Mild mucosal thickening in the right maxillary sinus. No air-fluid levels. Soft tissues: Soft tissue swelling overlying the bilateral nasal bones slightly greater on the right. There is additional facial soft tissue swelling overlying the maxillary sinuses, right greater than left. CT CERVICAL SPINE FINDINGS Alignment: Straightening of the cervical lordosis. No significant listhesis. No facet subluxation or dislocation. Skull base and vertebrae: No acute fracture. No primary bone lesion or focal pathologic process. Soft tissues and spinal canal: No prevertebral fluid or swelling. No visible canal hematoma. 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 Subcentimeter thyroid nodules, no follow-up required. Disc levels: Intervertebral disc space narrowing at multiple levels most pronounced at C3-4 and C4-5. No high-grade osseous spinal canal stenosis. Facet arthrosis and uncovertebral hypertrophy at multiple levels. Upper chest: Negative. Other: None. IMPRESSION: No CT evidence of acute intracranial abnormality. No acute fracture or traumatic malalignment of the cervical spine. Mildly displaced and angulated right nasal bone fracture and additional mildly displaced left nasal bone fracture with  overlying soft tissue swelling. Bilateral facial soft tissue swelling. Similar 1.5 cm extra-axial mass over the anterior left frontal lobe, favor meningioma. Chronic and degenerative changes as above. Electronically Signed   By: Donnice Mania M.D.   On: 09/10/2023 12:39   CT Cervical Spine Wo Contrast Result Date: 09/10/2023 CLINICAL DATA:  Head trauma, neck trauma. Mechanical fall yesterday. EXAM: CT HEAD WITHOUT CONTRAST CT MAXILLOFACIAL WITHOUT CONTRAST CT CERVICAL SPINE WITHOUT CONTRAST TECHNIQUE: Multidetector CT imaging of the head, cervical spine, and maxillofacial structures were performed using the standard protocol without intravenous contrast. Multiplanar CT image reconstructions of the cervical spine and maxillofacial structures were also generated. RADIATION DOSE REDUCTION: This exam was performed according to the departmental dose-optimization program which includes automated exposure control, adjustment of the mA and/or kV according to patient size and/or use of iterative reconstruction technique. COMPARISON:  CT head and cervical spine 02/08/2023. FINDINGS: CT HEAD FINDINGS Brain: No acute intracranial hemorrhage. No CT evidence of acute infarct. Nonspecific hypoattenuation in the periventricular and subcortical white matter favored to reflect chronic microvascular ischemic changes. Similar appearance of 1.5 cm hyperattenuating extra-axial mass over the anterior left frontal lobe. No edema, mass effect, or midline shift. The basilar cisterns are patent. Ventricles: The ventricles are normal. Vascular: No  hyperdense vessel or unexpected calcification. Skull: No acute or aggressive finding. Other: Mastoid air cells are clear. CT MAXILLOFACIAL FINDINGS Osseous: Irregularity of the right nasal bone with slight medial displacement and lateral apex angulation. Additional mild irregularity of the left nasal bone. There is narrowing of the nasal vestibule on the right. Maxillofacial bones are otherwise  intact. No mandibular dislocation. No destructive osseous lesion. Orbits: Globes are intact. Bilateral lens replacement. Extraocular muscles and optic nerve sheath complexes are unremarkable. Normal appearance of the orbital fat. Sinuses: Mild mucosal thickening in the right maxillary sinus. No air-fluid levels. Soft tissues: Soft tissue swelling overlying the bilateral nasal bones slightly greater on the right. There is additional facial soft tissue swelling overlying the maxillary sinuses, right greater than left. CT CERVICAL SPINE FINDINGS Alignment: Straightening of the cervical lordosis. No significant listhesis. No facet subluxation or dislocation. Skull base and vertebrae: No acute fracture. No primary bone lesion or focal pathologic process. Soft tissues and spinal canal: No prevertebral fluid or swelling. No visible canal hematoma. 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 Subcentimeter thyroid nodules, no follow-up required. Disc levels: Intervertebral disc space narrowing at multiple levels most pronounced at C3-4 and C4-5. No high-grade osseous spinal canal stenosis. Facet arthrosis and uncovertebral hypertrophy at multiple levels. Upper chest: Negative. Other: None. IMPRESSION: No CT evidence of acute intracranial abnormality. No acute fracture or traumatic malalignment of the cervical spine. Mildly displaced and angulated right nasal bone fracture and additional mildly displaced left nasal bone fracture with overlying soft tissue swelling. Bilateral facial soft tissue swelling. Similar 1.5 cm extra-axial mass over the anterior left frontal lobe, favor meningioma. Chronic and degenerative changes as above. Electronically Signed   By: Donnice Mania M.D.   On: 09/10/2023 12:39   CT HEAD WO CONTRAST ( ) Result Date: 09/10/2023 CLINICAL DATA:  Head trauma, neck trauma. Mechanical fall yesterday. EXAM: CT HEAD WITHOUT CONTRAST CT MAXILLOFACIAL WITHOUT  CONTRAST CT CERVICAL SPINE WITHOUT CONTRAST TECHNIQUE: Multidetector CT imaging of the head, cervical spine, and maxillofacial structures were performed using the standard protocol without intravenous contrast. Multiplanar CT image reconstructions of the cervical spine and maxillofacial structures were also generated. RADIATION DOSE REDUCTION: This exam was performed according to the departmental dose-optimization program which includes automated exposure control, adjustment of the mA and/or kV according to patient size and/or use of iterative reconstruction technique. COMPARISON:  CT head and cervical spine 02/08/2023. FINDINGS: CT HEAD FINDINGS Brain: No acute intracranial hemorrhage. No CT evidence of acute infarct. Nonspecific hypoattenuation in the periventricular and subcortical white matter favored to reflect chronic microvascular ischemic changes. Similar appearance of 1.5 cm hyperattenuating extra-axial mass over the anterior left frontal lobe. No edema, mass effect, or midline shift. The basilar cisterns are patent. Ventricles: The ventricles are normal. Vascular: No hyperdense vessel or unexpected calcification. Skull: No acute or aggressive finding. Other: Mastoid air cells are clear. CT MAXILLOFACIAL FINDINGS Osseous: Irregularity of the right nasal bone with slight medial displacement and lateral apex angulation. Additional mild irregularity of the left nasal bone. There is narrowing of the nasal vestibule on the right. Maxillofacial bones are otherwise intact. No mandibular dislocation. No destructive osseous lesion. Orbits: Globes are intact. Bilateral lens replacement. Extraocular muscles and optic nerve sheath complexes are unremarkable. Normal appearance of the orbital fat. Sinuses: Mild mucosal thickening in the right maxillary sinus. No air-fluid levels. Soft tissues: Soft tissue swelling overlying the bilateral nasal bones slightly greater on the right. There is additional facial  soft tissue  swelling overlying the maxillary sinuses, right greater than left. CT CERVICAL SPINE FINDINGS Alignment: Straightening of the cervical lordosis. No significant listhesis. No facet subluxation or dislocation. Skull base and vertebrae: No acute fracture. No primary bone lesion or focal pathologic process. Soft tissues and spinal canal: No prevertebral fluid or swelling. No visible canal hematoma. 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 Subcentimeter thyroid nodules, no follow-up required. Disc levels: Intervertebral disc space narrowing at multiple levels most pronounced at C3-4 and C4-5. No high-grade osseous spinal canal stenosis. Facet arthrosis and uncovertebral hypertrophy at multiple levels. Upper chest: Negative. Other: None. IMPRESSION: No CT evidence of acute intracranial abnormality. No acute fracture or traumatic malalignment of the cervical spine. Mildly displaced and angulated right nasal bone fracture and additional mildly displaced left nasal bone fracture with overlying soft tissue swelling. Bilateral facial soft tissue swelling. Similar 1.5 cm extra-axial mass over the anterior left frontal lobe, favor meningioma. Chronic and degenerative changes as above. Electronically Signed   By: Donnice Mania M.D.   On: 09/10/2023 12:39     PROCEDURES:  Critical Care performed: No  Procedures   MEDICATIONS ORDERED IN ED: Medications  bacitracin  ointment (1 Application Topical Given 09/10/23 1314)     IMPRESSION / MDM / ASSESSMENT AND PLAN / ED COURSE  I reviewed the triage vital signs and the nursing notes.                              Differential diagnosis includes, but is not limited to, SDH, skull fracture orbital bone fracture, facial bone fracture, nasal fracture, cervical fracture, cervical radiculopathy, myalgias  Patient's presentation is most consistent with acute complicated illness / injury requiring diagnostic  workup.  Patient's diagnosis is consistent with 1 day status post mechanical fall resulting in facial trauma.  Patient presents to the ED from local urgent care for evaluation of her injuries.  She presents with bilateral periorbital ecchymosis central face abrasion and subtle swelling over the nasal bridge concerning for acute injury.  CT images reviewed by me are interpreted to revealed no acute intracranial process.  Patient with stable mildly displaced nasal bone fractures, and no cervical spine findings.  Patient is neurologically intact with no evidence of corneal abrasion or globe trauma.  Patient will be discharged home with wound care instructions. Patient is to follow up with her primary provider as suggested, as needed or otherwise directed. Patient is given ED precautions to return to the ED for any worsening or new symptoms.  Clinical Course as of 09/10/23 1718  Sat Sep 10, 2023  1253 CT Cervical Spine Wo Contrast [JM]    Clinical Course User Index [JM] Zerek Litsey, Candida LULLA Kings, PA-C    FINAL CLINICAL IMPRESSION(S) / ED DIAGNOSES   Final diagnoses:  Fall in home, initial encounter  Contusion of face, initial encounter  Closed fracture of nasal bone, initial encounter     Rx / DC Orders   ED Discharge Orders     None        Note:  This document was prepared using Dragon voice recognition software and may include unintentional dictation errors.    Loyd Candida LULLA Kings, PA-C 09/10/23 1722    Dorothyann Drivers, MD 09/10/23 DANIAL

## 2023-09-10 NOTE — ED Notes (Signed)
 Pt to CT

## 2023-11-03 ENCOUNTER — Other Ambulatory Visit: Payer: Self-pay | Admitting: Internal Medicine

## 2023-11-03 DIAGNOSIS — Z1231 Encounter for screening mammogram for malignant neoplasm of breast: Secondary | ICD-10-CM

## 2023-11-03 NOTE — Progress Notes (Unsigned)
 Referring Physician:  Milissa Hamming, MD [address] [place],  [place] [address]  Primary Physician:  Auston Reyes BIRCH, MD  History of Present Illness: 11/09/2023 Gina Arnold is here today after having a fall approximately 2 months ago and vaginal being seen in the ED was found to have a 1.5 cm mass over the anterior left frontal lobe favoring to be a meningioma.  She has no headaches, changes to her vision, neurologic changes that she is able to appreciate.  She has no complaints at this time.   Past Surgery: None  Gina Arnold has no symptoms of cervical myelopathy.  The symptoms are causing a significant impact on the patient's life.   Review of Systems:  A 10 point review of systems is negative, except for the pertinent positives and negatives detailed in the HPI.  Past Medical History: Past Medical History:  Diagnosis Date   GERD (gastroesophageal reflux disease)    Hypertension     Past Surgical History: Past Surgical History:  Procedure Laterality Date   ABDOMINAL HYSTERECTOMY     APPENDECTOMY     CHOLECYSTECTOMY     ESOPHAGOGASTRODUODENOSCOPY (EGD) WITH PROPOFOL  N/A 09/01/2022   Procedure: ESOPHAGOGASTRODUODENOSCOPY (EGD) WITH PROPOFOL ;  Surgeon: Therisa Bi, MD;  Location: [facility] ENDOSCOPY;  Service: Gastroenterology;  Laterality: N/A;   EYE SURGERY     FLEXIBLE SIGMOIDOSCOPY N/A 09/01/2022   Procedure: FLEXIBLE SIGMOIDOSCOPY;  Surgeon: Therisa Bi, MD;  Location: [facility] ENDOSCOPY;  Service: Gastroenterology;  Laterality: N/A;   TONSILLECTOMY      Allergies: Allergies as of 11/09/2023 - Review Complete 11/09/2023  Allergen Reaction Noted   Hydrocodone-acetaminophen Itching and Other (See Comments) 10/16/2013   Sulfasalazine Nausea And Vomiting 05/31/2016   Ciprofloxacin Nausea And Vomiting and Nausea Only 08/02/2016   Codeine Itching 11/10/2013   Metronidazole Nausea And Vomiting and Nausea Only 08/02/2016   Sulfa antibiotics  Other (See Comments) 02/17/2015   Tramadol Nausea And Vomiting 02/17/2015    Medications: Outpatient Encounter Medications as of 11/09/2023  Medication Sig   amLODipine (NORVASC) 5 MG tablet Take 1 tablet by mouth daily.   b complex vitamins capsule Take 1 capsule by mouth daily.   Calcium Carbonate 500 MG CHEW Chew by mouth.   estradiol  (ESTRACE ) 0.1 MG/GM vaginal cream Apply a pea-sized amount to fingertip or Q-tip and wipe vaginal roof twice weekly   imipramine (TOFRANIL) 25 MG tablet Take 1 tablet by mouth at bedtime.   magnesium oxide (MAG-OX) 400 MG tablet Take 400 mg by mouth daily.   melatonin 1 MG TABS tablet Take 1 mg by mouth at bedtime as needed.   omeprazole  (PRILOSEC) 20 MG capsule TAKE 1 CAPSULE BY MOUTH TWICE A DAY BEFORE A MEAL   oxybutynin (DITROPAN) 5 MG tablet Take 5 mg by mouth 3 (three) times daily.   phentermine (ADIPEX-P) 37.5 MG tablet Take 37.5 mg by mouth daily before breakfast.   simvastatin (ZOCOR) 20 MG tablet Take 1 tablet by mouth at bedtime.   topiramate (TOPAMAX) 50 MG tablet Take 50 mg by mouth daily.   UNABLE TO FIND Take by mouth.   [DISCONTINUED] tirzepatide (ZEPBOUND) 5 MG/0.5ML Pen Inject 5 mg into the skin once a week.   [DISCONTINUED] Vonoprazan Fumarate (VOQUEZNA) 20 MG TABS Take 1 tablet by mouth daily.   No facility-administered encounter medications on file as of 11/09/2023.    Social History: Social History   Tobacco Use   Smoking status: Former    Current packs/day:  0.00    Types: Cigarettes    Quit date: 50    Years since quitting: 52.6   Smokeless tobacco: Never  Vaping Use   Vaping status: Never Used  Substance Use Topics   Alcohol use: Yes    Comment: occasionally   Drug use: Never    Family Medical History: No family history on file.  Physical Examination: @VITALWITHPAIN @  General: Patient is well developed, well nourished, calm, collected, and in no apparent distress. Attention to examination is  appropriate.  Psychiatric: Patient is non-anxious.  Head:  Pupils equal, round, and reactive to light.  ENT:  Oral mucosa appears well hydrated.  Neck:   Supple.  Full range of motion.  Respiratory: Patient is breathing without any difficulty.  Extremities: No edema.  Vascular: Palpable dorsal pedal pulses.  Skin:   On exposed skin, there are no abnormal skin lesions.  NEUROLOGICAL:     Awake, alert, oriented to person, place, and time.  Speech is clear and fluent. Fund of knowledge is appropriate.   Cranial Nerves: Pupils equal round and reactive to light.  Facial tone is symmetric.  Facial sensation is symmetric.  Cranial nerves grossly intact.  No pronator drift.   Strength: Side Biceps Triceps Deltoid Interossei Grip Wrist Ext. Wrist Flex.  R 5 5 5 5 5 5 5   L 5 5 5 5 5 5 5     Hoffman's is absent.  Bilateral upper and lower extremity sensation is intact to light touch.    Gait is normal.   No difficulty with tandem gait.   No evidence of dysmetria noted.  Medical Decision Making  Imaging: EXAM: CT HEAD WITHOUT CONTRAST   CT MAXILLOFACIAL WITHOUT CONTRAST   CT CERVICAL SPINE WITHOUT CONTRAST   TECHNIQUE: Multidetector CT imaging of the head, cervical spine, and maxillofacial structures were performed using the standard protocol without intravenous contrast. Multiplanar CT image reconstructions of the cervical spine and maxillofacial structures were also generated.   RADIATION DOSE REDUCTION: This exam was performed according to the departmental dose-optimization program which includes automated exposure control, adjustment of the mA and/or kV according to patient size and/or use of iterative reconstruction technique.   COMPARISON:  CT head and cervical spine 02/08/2023.   FINDINGS: CT HEAD FINDINGS   Brain: No acute intracranial hemorrhage. No CT evidence of acute infarct. Nonspecific hypoattenuation in the periventricular and subcortical white  matter favored to reflect chronic microvascular ischemic changes. Similar appearance of 1.5 cm hyperattenuating extra-axial mass over the anterior left frontal lobe. No edema, mass effect, or midline shift. The basilar cisterns are patent.   Ventricles: The ventricles are normal.   Vascular: No hyperdense vessel or unexpected calcification.   Skull: No acute or aggressive finding.   Other: Mastoid air cells are clear.   CT MAXILLOFACIAL FINDINGS   Osseous: Irregularity of the right nasal bone with slight medial displacement and lateral apex angulation. Additional mild irregularity of the left nasal bone. There is narrowing of the nasal vestibule on the right. Maxillofacial bones are otherwise intact. No mandibular dislocation. No destructive osseous lesion.   Orbits: Globes are intact. Bilateral lens replacement. Extraocular muscles and optic nerve sheath complexes are unremarkable. Normal appearance of the orbital fat.   Sinuses: Mild mucosal thickening in the right maxillary sinus. No air-fluid levels.   Soft tissues: Soft tissue swelling overlying the bilateral nasal bones slightly greater on the right. There is additional facial soft tissue swelling overlying the maxillary sinuses, right greater than left.  CT CERVICAL SPINE FINDINGS   Alignment: Straightening of the cervical lordosis. No significant listhesis. No facet subluxation or dislocation.   Skull base and vertebrae: No acute fracture. No primary bone lesion or focal pathologic process.   Soft tissues and spinal canal: No prevertebral fluid or swelling. No visible canal hematoma. 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 Subcentimeter thyroid nodules, no follow-up required.   Disc levels: Intervertebral disc space narrowing at multiple levels most pronounced at C3-4 and C4-5. No high-grade osseous spinal canal stenosis. Facet arthrosis and uncovertebral  hypertrophy at multiple levels.   Upper chest: Negative.   Other: None.   IMPRESSION: No CT evidence of acute intracranial abnormality.   No acute fracture or traumatic malalignment of the cervical spine.   Mildly displaced and angulated right nasal bone fracture and additional mildly displaced left nasal bone fracture with overlying soft tissue swelling. Bilateral facial soft tissue swelling.   Similar 1.5 cm extra-axial mass over the anterior left frontal lobe, favor meningioma.   Chronic and degenerative changes as above.    I have personally reviewed the images and agree with the above interpretation.  Assessment and Plan: Ms. Gina Arnold is a pleasant 78 y.o. female is here today after having a fall approximately 2 months ago and vaginal being seen in the ED was found to have a 1.5 cm mass over the anterior left frontal lobe favoring to be a meningioma.  She has no headaches, changes to her vision, neurologic changes that she is able to appreciate.  She has no complaints at this time.  Her exam is normal.  -Plan for MRI of brain for better evaluation with follow-up in 6 to 12 months.  Will coordinate the care for this. - Red flag symptoms reviewed with patient in which she was asked to reach out to us  for any changes.  Thank you for involving me in the care of this patient.   I spent a total of 45 minutes in both face-to-face and non-face-to-face activities for this visit on the date of this encounter including preparing to see the patient, obtaining and reviewing separately obtained history, performing medically appropriate examination, counseling the patient, ordering additional tests, documenting clinical information, independently interpreting results, coordination of care.   Lyle Decamp, PA-C Dept. of Neurosurgery

## 2023-11-09 ENCOUNTER — Encounter: Payer: Self-pay | Admitting: Physician Assistant

## 2023-11-09 ENCOUNTER — Ambulatory Visit: Admitting: Physician Assistant

## 2023-11-09 VITALS — BP 134/70 | Ht 62.21 in | Wt 161.2 lb

## 2023-11-09 DIAGNOSIS — D329 Benign neoplasm of meninges, unspecified: Secondary | ICD-10-CM

## 2023-11-09 DIAGNOSIS — D430 Neoplasm of uncertain behavior of brain, supratentorial: Secondary | ICD-10-CM | POA: Diagnosis not present

## 2023-11-14 ENCOUNTER — Encounter: Payer: Self-pay | Admitting: Physician Assistant

## 2023-11-16 ENCOUNTER — Ambulatory Visit
Admission: RE | Admit: 2023-11-16 | Discharge: 2023-11-16 | Disposition: A | Discharge status: Home / Self Care | Source: Ambulatory Visit | Attending: Physician Assistant | Admitting: Physician Assistant

## 2023-11-16 DIAGNOSIS — D329 Benign neoplasm of meninges, unspecified: Secondary | ICD-10-CM

## 2023-11-16 MED ORDER — GADOPICLENOL 0.5 MMOL/ML IV SOLN
7.5000 mL | Freq: Once | INTRAVENOUS | Status: AC | PRN
  Administered 2023-11-16: 7.5 mL via INTRAVENOUS

## 2023-11-28 ENCOUNTER — Inpatient Hospital Stay (HOSPITAL_COMMUNITY)

## 2023-11-28 ENCOUNTER — Emergency Department (HOSPITAL_COMMUNITY)

## 2023-11-28 ENCOUNTER — Inpatient Hospital Stay (HOSPITAL_COMMUNITY)
Admission: EM | Admit: 2023-11-28 | Discharge: 2023-11-30 | DRG: 494 | Disposition: A | Discharge status: Home Health | Attending: Internal Medicine | Admitting: Internal Medicine

## 2023-11-28 ENCOUNTER — Other Ambulatory Visit: Payer: Self-pay

## 2023-11-28 ENCOUNTER — Encounter (HOSPITAL_COMMUNITY)
Admission: EM | Disposition: A | Discharge status: Home Health | Payer: Self-pay | Source: Home / Self Care | Attending: Internal Medicine

## 2023-11-28 ENCOUNTER — Inpatient Hospital Stay (HOSPITAL_COMMUNITY): Admitting: Anesthesiology

## 2023-11-28 ENCOUNTER — Encounter (HOSPITAL_COMMUNITY): Payer: Self-pay | Admitting: Emergency Medicine

## 2023-11-28 DIAGNOSIS — Z885 Allergy status to narcotic agent status: Secondary | ICD-10-CM | POA: Diagnosis not present

## 2023-11-28 DIAGNOSIS — D649 Anemia, unspecified: Secondary | ICD-10-CM | POA: Diagnosis present

## 2023-11-28 DIAGNOSIS — Z87891 Personal history of nicotine dependence: Secondary | ICD-10-CM

## 2023-11-28 DIAGNOSIS — S82842B Displaced bimalleolar fracture of left lower leg, initial encounter for open fracture type I or II: Secondary | ICD-10-CM

## 2023-11-28 DIAGNOSIS — X501XXA Overexertion from prolonged static or awkward postures, initial encounter: Secondary | ICD-10-CM

## 2023-11-28 DIAGNOSIS — D329 Benign neoplasm of meninges, unspecified: Secondary | ICD-10-CM | POA: Diagnosis present

## 2023-11-28 DIAGNOSIS — I10 Essential (primary) hypertension: Secondary | ICD-10-CM | POA: Diagnosis present

## 2023-11-28 DIAGNOSIS — E66811 Obesity, class 1: Secondary | ICD-10-CM

## 2023-11-28 DIAGNOSIS — Z888 Allergy status to other drugs, medicaments and biological substances status: Secondary | ICD-10-CM | POA: Diagnosis not present

## 2023-11-28 DIAGNOSIS — E559 Vitamin D deficiency, unspecified: Secondary | ICD-10-CM | POA: Diagnosis present

## 2023-11-28 DIAGNOSIS — Z79899 Other long term (current) drug therapy: Secondary | ICD-10-CM | POA: Diagnosis not present

## 2023-11-28 DIAGNOSIS — Z9181 History of falling: Secondary | ICD-10-CM

## 2023-11-28 DIAGNOSIS — S82892A Other fracture of left lower leg, initial encounter for closed fracture: Secondary | ICD-10-CM | POA: Diagnosis present

## 2023-11-28 DIAGNOSIS — W108XXA Fall (on) (from) other stairs and steps, initial encounter: Secondary | ICD-10-CM

## 2023-11-28 DIAGNOSIS — E785 Hyperlipidemia, unspecified: Secondary | ICD-10-CM | POA: Diagnosis present

## 2023-11-28 DIAGNOSIS — K2100 Gastro-esophageal reflux disease with esophagitis, without bleeding: Secondary | ICD-10-CM | POA: Diagnosis present

## 2023-11-28 DIAGNOSIS — G8929 Other chronic pain: Secondary | ICD-10-CM | POA: Diagnosis present

## 2023-11-28 DIAGNOSIS — W19XXXA Unspecified fall, initial encounter: Principal | ICD-10-CM

## 2023-11-28 DIAGNOSIS — Z882 Allergy status to sulfonamides status: Secondary | ICD-10-CM | POA: Diagnosis not present

## 2023-11-28 DIAGNOSIS — W109XXA Fall (on) (from) unspecified stairs and steps, initial encounter: Secondary | ICD-10-CM | POA: Diagnosis present

## 2023-11-28 DIAGNOSIS — S82842A Displaced bimalleolar fracture of left lower leg, initial encounter for closed fracture: Secondary | ICD-10-CM

## 2023-11-28 DIAGNOSIS — M549 Dorsalgia, unspecified: Secondary | ICD-10-CM | POA: Diagnosis present

## 2023-11-28 DIAGNOSIS — D320 Benign neoplasm of cerebral meninges: Secondary | ICD-10-CM | POA: Diagnosis not present

## 2023-11-28 DIAGNOSIS — Z90710 Acquired absence of both cervix and uterus: Secondary | ICD-10-CM | POA: Diagnosis not present

## 2023-11-28 DIAGNOSIS — N393 Stress incontinence (female) (male): Secondary | ICD-10-CM | POA: Diagnosis present

## 2023-11-28 HISTORY — PX: ORIF ANKLE FRACTURE: SHX5408

## 2023-11-28 LAB — SURGICAL PCR SCREEN
MRSA, PCR: NEGATIVE
Staphylococcus aureus: POSITIVE — AB

## 2023-11-28 LAB — CBC
HCT: 34.5 % — ABNORMAL LOW (ref 36.0–46.0)
Hemoglobin: 11.8 g/dL — ABNORMAL LOW (ref 12.0–15.0)
MCH: 29.6 pg (ref 26.0–34.0)
MCHC: 34.2 g/dL (ref 30.0–36.0)
MCV: 86.5 fL (ref 80.0–100.0)
Platelets: 180 K/uL (ref 150–400)
RBC: 3.99 MIL/uL (ref 3.87–5.11)
RDW: 12.6 % (ref 11.5–15.5)
WBC: 6.8 K/uL (ref 4.0–10.5)
nRBC: 0 % (ref 0.0–0.2)

## 2023-11-28 LAB — BASIC METABOLIC PANEL WITH GFR
Anion gap: 12 (ref 5–15)
BUN: 19 mg/dL (ref 8–23)
CO2: 21 mmol/L — ABNORMAL LOW (ref 22–32)
Calcium: 8.6 mg/dL — ABNORMAL LOW (ref 8.9–10.3)
Chloride: 108 mmol/L (ref 98–111)
Creatinine, Ser: 0.77 mg/dL (ref 0.44–1.00)
GFR, Estimated: >60 mL/min (ref >60)
Glucose, Bld: 126 mg/dL — ABNORMAL HIGH (ref 70–99)
Potassium: 3.6 mmol/L (ref 3.5–5.1)
Sodium: 141 mmol/L (ref 135–145)

## 2023-11-28 LAB — VITAMIN D 25 HYDROXY (VIT D DEFICIENCY, FRACTURES): Vit D, 25-Hydroxy: 67.25 ng/mL (ref 30–100)

## 2023-11-28 SURGERY — OPEN REDUCTION INTERNAL FIXATION (ORIF) ANKLE FRACTURE
Anesthesia: General | Site: Ankle | Laterality: Left

## 2023-11-28 MED ORDER — FENTANYL CITRATE (PF) 250 MCG/5ML IJ SOLN
INTRAMUSCULAR | Status: AC
  Filled 2023-11-28: qty 5

## 2023-11-28 MED ORDER — POLYETHYLENE GLYCOL 3350 17 G PO PACK: 17.0000 g | PACK | Freq: Every day | ORAL | Status: DC | PRN

## 2023-11-28 MED ORDER — VANCOMYCIN HCL 1000 MG IV SOLR
INTRAVENOUS | Status: DC | PRN
  Administered 2023-11-28: 1000 mg via TOPICAL

## 2023-11-28 MED ORDER — SODIUM CHLORIDE 0.9 % IV SOLN: INTRAVENOUS | Status: AC

## 2023-11-28 MED ORDER — OXYCODONE HCL 5 MG PO TABS
2.5000 mg | ORAL_TABLET | ORAL | Status: DC | PRN
  Administered 2023-11-29 (×3): 5 mg via ORAL
  Filled 2023-11-28 (×3): qty 1

## 2023-11-28 MED ORDER — PROPOFOL 10 MG/ML IV BOLUS: 0.5000 mg/kg | Freq: Once | INTRAVENOUS | Status: DC

## 2023-11-28 MED ORDER — CEFAZOLIN SODIUM-DEXTROSE 2-4 GM/100ML-% IV SOLN
2.0000 g | INTRAVENOUS | Status: AC
  Administered 2023-11-28: 2 g via INTRAVENOUS

## 2023-11-28 MED ORDER — DOCUSATE SODIUM 100 MG PO CAPS
100.0000 mg | ORAL_CAPSULE | Freq: Two times a day (BID) | ORAL | Status: DC
  Administered 2023-11-28 – 2023-11-30 (×4): 100 mg via ORAL
  Filled 2023-11-28 (×4): qty 1

## 2023-11-28 MED ORDER — METHOCARBAMOL 1000 MG/10ML IJ SOLN: 500.0000 mg | Freq: Four times a day (QID) | INTRAMUSCULAR | Status: DC | PRN

## 2023-11-28 MED ORDER — MUPIROCIN 2 % EX OINT
1.0000 | TOPICAL_OINTMENT | Freq: Two times a day (BID) | CUTANEOUS | Status: DC
  Administered 2023-11-28: 1 via NASAL
  Filled 2023-11-28: qty 22

## 2023-11-28 MED ORDER — FENTANYL CITRATE (PF) 250 MCG/5ML IJ SOLN
INTRAMUSCULAR | Status: DC | PRN
  Administered 2023-11-28: 25 ug via INTRAVENOUS
  Administered 2023-11-28 (×2): 50 ug via INTRAVENOUS
  Administered 2023-11-28: 100 ug via INTRAVENOUS

## 2023-11-28 MED ORDER — VANCOMYCIN HCL 1000 MG IV SOLR
INTRAVENOUS | Status: AC
  Filled 2023-11-28: qty 20

## 2023-11-28 MED ORDER — CHLORHEXIDINE GLUCONATE 0.12 % MT SOLN: 15.0000 mL | Freq: Once | OROMUCOSAL | Status: AC

## 2023-11-28 MED ORDER — LACTATED RINGERS IV SOLN: INTRAVENOUS | Status: DC

## 2023-11-28 MED ORDER — PANTOPRAZOLE SODIUM 40 MG PO TBEC: 40.0000 mg | DELAYED_RELEASE_TABLET | Freq: Every day | ORAL | Status: DC

## 2023-11-28 MED ORDER — BUPIVACAINE HCL (PF) 0.5 % IJ SOLN
INTRAMUSCULAR | Status: AC
  Filled 2023-11-28: qty 30

## 2023-11-28 MED ORDER — ROCURONIUM BROMIDE 10 MG/ML (PF) SYRINGE
PREFILLED_SYRINGE | INTRAVENOUS | Status: AC
  Filled 2023-11-28: qty 20

## 2023-11-28 MED ORDER — ONDANSETRON HCL 4 MG PO TABS: 4.0000 mg | ORAL_TABLET | Freq: Four times a day (QID) | ORAL | Status: DC | PRN

## 2023-11-28 MED ORDER — ENOXAPARIN SODIUM 40 MG/0.4ML IJ SOSY
40.0000 mg | PREFILLED_SYRINGE | INTRAMUSCULAR | Status: DC
  Administered 2023-11-29 – 2023-11-30 (×2): 40 mg via SUBCUTANEOUS
  Filled 2023-11-28 (×2): qty 0.4

## 2023-11-28 MED ORDER — MAGNESIUM CITRATE PO SOLN: 1.0000 | Freq: Once | ORAL | Status: DC | PRN

## 2023-11-28 MED ORDER — ORAL CARE MOUTH RINSE: 15.0000 mL | Freq: Once | OROMUCOSAL | Status: AC

## 2023-11-28 MED ORDER — CHLORHEXIDINE GLUCONATE CLOTH 2 % EX PADS
6.0000 | MEDICATED_PAD | Freq: Every day | CUTANEOUS | Status: DC
  Administered 2023-11-28: 6 via TOPICAL

## 2023-11-28 MED ORDER — FENTANYL CITRATE PF 50 MCG/ML IJ SOSY
100.0000 ug | PREFILLED_SYRINGE | Freq: Once | INTRAMUSCULAR | Status: AC
  Administered 2023-11-28: 100 ug via INTRAVENOUS
  Filled 2023-11-28: qty 2

## 2023-11-28 MED ORDER — PROPOFOL 1000 MG/100ML IV EMUL
INTRAVENOUS | Status: AC
  Filled 2023-11-28: qty 100

## 2023-11-28 MED ORDER — FENTANYL CITRATE (PF) 100 MCG/2ML IJ SOLN
25.0000 ug | INTRAMUSCULAR | Status: DC | PRN
  Administered 2023-11-28 (×2): 25 ug via INTRAVENOUS

## 2023-11-28 MED ORDER — METHOCARBAMOL 500 MG PO TABS
500.0000 mg | ORAL_TABLET | Freq: Four times a day (QID) | ORAL | Status: DC | PRN
  Administered 2023-11-28 – 2023-11-30 (×5): 500 mg via ORAL
  Filled 2023-11-28 (×5): qty 1

## 2023-11-28 MED ORDER — BACITRACIN ZINC 500 UNIT/GM EX OINT
TOPICAL_OINTMENT | CUTANEOUS | Status: AC
  Filled 2023-11-28: qty 28.35

## 2023-11-28 MED ORDER — CHLORHEXIDINE GLUCONATE 4 % EX SOLN: 60.0000 mL | Freq: Once | CUTANEOUS | Status: DC

## 2023-11-28 MED ORDER — ONDANSETRON HCL 4 MG/2ML IJ SOLN
INTRAMUSCULAR | Status: DC | PRN
  Administered 2023-11-28: 4 mg via INTRAVENOUS

## 2023-11-28 MED ORDER — ONDANSETRON HCL 4 MG/2ML IJ SOLN
INTRAMUSCULAR | Status: AC
  Filled 2023-11-28: qty 4

## 2023-11-28 MED ORDER — FENTANYL CITRATE (PF) 100 MCG/2ML IJ SOLN
INTRAMUSCULAR | Status: AC
  Filled 2023-11-28: qty 2

## 2023-11-28 MED ORDER — AMLODIPINE BESYLATE 5 MG PO TABS
5.0000 mg | ORAL_TABLET | Freq: Every day | ORAL | Status: DC
  Administered 2023-11-29 – 2023-11-30 (×2): 5 mg via ORAL
  Filled 2023-11-28 (×2): qty 1

## 2023-11-28 MED ORDER — PROPOFOL 10 MG/ML IV BOLUS
0.5000 mg/kg | Freq: Once | INTRAVENOUS | Status: AC
  Administered 2023-11-28: 36.3 mg via INTRAVENOUS
  Filled 2023-11-28: qty 20

## 2023-11-28 MED ORDER — DEXAMETHASONE SODIUM PHOSPHATE 10 MG/ML IJ SOLN
INTRAMUSCULAR | Status: DC | PRN
  Administered 2023-11-28: 5 mg via INTRAVENOUS

## 2023-11-28 MED ORDER — PHENYLEPHRINE 80 MCG/ML (10ML) SYRINGE FOR IV PUSH (FOR BLOOD PRESSURE SUPPORT)
PREFILLED_SYRINGE | INTRAVENOUS | Status: DC | PRN
  Administered 2023-11-28 (×2): 80 ug via INTRAVENOUS

## 2023-11-28 MED ORDER — ACETAMINOPHEN 500 MG PO TABS
1000.0000 mg | ORAL_TABLET | Freq: Four times a day (QID) | ORAL | Status: DC
  Administered 2023-11-28 – 2023-11-30 (×6): 1000 mg via ORAL
  Filled 2023-11-28 (×6): qty 2

## 2023-11-28 MED ORDER — CHLORHEXIDINE GLUCONATE 0.12 % MT SOLN
OROMUCOSAL | Status: AC
  Administered 2023-11-28: 15 mL via OROMUCOSAL
  Filled 2023-11-28: qty 15

## 2023-11-28 MED ORDER — OXYCODONE HCL 5 MG PO TABS: 5.0000 mg | ORAL_TABLET | Freq: Once | ORAL | Status: DC | PRN

## 2023-11-28 MED ORDER — POVIDONE-IODINE 10 % EX SWAB: 2.0000 | Freq: Once | CUTANEOUS | Status: DC

## 2023-11-28 MED ORDER — CEFAZOLIN SODIUM-DEXTROSE 2-4 GM/100ML-% IV SOLN
2.0000 g | Freq: Three times a day (TID) | INTRAVENOUS | Status: AC
  Administered 2023-11-28 – 2023-11-29 (×3): 2 g via INTRAVENOUS
  Filled 2023-11-28 (×3): qty 100

## 2023-11-28 MED ORDER — DEXAMETHASONE SODIUM PHOSPHATE 10 MG/ML IJ SOLN
INTRAMUSCULAR | Status: AC
  Filled 2023-11-28: qty 2

## 2023-11-28 MED ORDER — PROPOFOL 10 MG/ML IV BOLUS
INTRAVENOUS | Status: AC
  Filled 2023-11-28: qty 20

## 2023-11-28 MED ORDER — BISACODYL 5 MG PO TBEC: 5.0000 mg | DELAYED_RELEASE_TABLET | Freq: Every day | ORAL | Status: DC | PRN

## 2023-11-28 MED ORDER — SIMVASTATIN 20 MG PO TABS
20.0000 mg | ORAL_TABLET | Freq: Every day | ORAL | Status: DC
  Administered 2023-11-29: 20 mg via ORAL
  Filled 2023-11-28: qty 1

## 2023-11-28 MED ORDER — PHENYLEPHRINE 80 MCG/ML (10ML) SYRINGE FOR IV PUSH (FOR BLOOD PRESSURE SUPPORT)
PREFILLED_SYRINGE | INTRAVENOUS | Status: AC
  Filled 2023-11-28: qty 20

## 2023-11-28 MED ORDER — ENOXAPARIN SODIUM 40 MG/0.4ML IJ SOSY: 40.0000 mg | PREFILLED_SYRINGE | INTRAMUSCULAR | Status: DC

## 2023-11-28 MED ORDER — PROPOFOL 10 MG/ML IV BOLUS
INTRAVENOUS | Status: DC | PRN
  Administered 2023-11-28: 140 mg via INTRAVENOUS

## 2023-11-28 MED ORDER — ONDANSETRON HCL 4 MG/2ML IJ SOLN: 4.0000 mg | Freq: Once | INTRAMUSCULAR | Status: DC | PRN

## 2023-11-28 MED ORDER — LIDOCAINE 2% (20 MG/ML) 5 ML SYRINGE
INTRAMUSCULAR | Status: DC | PRN
  Administered 2023-11-28: 40 mg via INTRAVENOUS

## 2023-11-28 MED ORDER — SODIUM CHLORIDE 0.9 % IR SOLN
Status: DC | PRN
  Administered 2023-11-28: 3000 mL

## 2023-11-28 MED ORDER — 0.9 % SODIUM CHLORIDE (POUR BTL) OPTIME
TOPICAL | Status: DC | PRN
  Administered 2023-11-28: 1000 mL

## 2023-11-28 MED ORDER — METOCLOPRAMIDE HCL 5 MG PO TABS: 5.0000 mg | ORAL_TABLET | Freq: Three times a day (TID) | ORAL | Status: DC | PRN

## 2023-11-28 MED ORDER — ONDANSETRON HCL 4 MG/2ML IJ SOLN: 4.0000 mg | Freq: Four times a day (QID) | INTRAMUSCULAR | Status: DC | PRN

## 2023-11-28 MED ORDER — HYDROMORPHONE HCL 1 MG/ML IJ SOLN: 0.5000 mg | INTRAMUSCULAR | Status: DC | PRN

## 2023-11-28 MED ORDER — DIPHENHYDRAMINE HCL 12.5 MG/5ML PO ELIX: 12.5000 mg | ORAL_SOLUTION | ORAL | Status: DC | PRN

## 2023-11-28 MED ORDER — OXYCODONE HCL 5 MG/5ML PO SOLN: 5.0000 mg | Freq: Once | ORAL | Status: DC | PRN

## 2023-11-28 MED ORDER — ACETAMINOPHEN 500 MG PO TABS
1000.0000 mg | ORAL_TABLET | Freq: Once | ORAL | Status: AC
  Administered 2023-11-28: 1000 mg via ORAL
  Filled 2023-11-28: qty 2

## 2023-11-28 MED ORDER — ROCURONIUM BROMIDE 10 MG/ML (PF) SYRINGE
PREFILLED_SYRINGE | INTRAVENOUS | Status: DC | PRN
  Administered 2023-11-28: 50 mg via INTRAVENOUS

## 2023-11-28 MED ORDER — LIDOCAINE 2% (20 MG/ML) 5 ML SYRINGE
INTRAMUSCULAR | Status: AC
  Filled 2023-11-28: qty 10

## 2023-11-28 MED ORDER — METOCLOPRAMIDE HCL 5 MG/ML IJ SOLN: 5.0000 mg | Freq: Three times a day (TID) | INTRAMUSCULAR | Status: DC | PRN

## 2023-11-28 MED ORDER — HYDROMORPHONE HCL 1 MG/ML IJ SOLN: 0.5000 mg | Freq: Once | INTRAMUSCULAR | Status: DC

## 2023-11-28 MED ORDER — SUGAMMADEX SODIUM 200 MG/2ML IV SOLN
INTRAVENOUS | Status: DC | PRN
  Administered 2023-11-28: 200 mg via INTRAVENOUS

## 2023-11-28 SURGICAL SUPPLY — 52 items
BIT DRILL QC 2.0 SHORT EVOS SM (DRILL) IMPLANT
BIT DRILL QC 2.5MM SHRT EVO SM (DRILL) IMPLANT
BNDG COHESIVE 4X5 TAN STRL LF (GAUZE/BANDAGES/DRESSINGS) ×1 IMPLANT
BNDG ELASTIC 4INX 5YD STR LF (GAUZE/BANDAGES/DRESSINGS) IMPLANT
BNDG ELASTIC 4X5.8 VLCR STR LF (GAUZE/BANDAGES/DRESSINGS) IMPLANT
BNDG ELASTIC 6INX 5YD STR LF (GAUZE/BANDAGES/DRESSINGS) IMPLANT
BRUSH SCRUB EZ PLAIN DRY (MISCELLANEOUS) ×2 IMPLANT
CHLORAPREP W/TINT 26 (MISCELLANEOUS) ×1 IMPLANT
COVER SURGICAL LIGHT HANDLE (MISCELLANEOUS) ×1 IMPLANT
DRAPE C-ARM 42X72 X-RAY (DRAPES) ×1 IMPLANT
DRAPE C-ARMOR (DRAPES) ×1 IMPLANT
DRAPE SURG ORHT 6 SPLT 77X108 (DRAPES) ×2 IMPLANT
DRAPE U-SHAPE 47X51 STRL (DRAPES) ×1 IMPLANT
DRSG ADAPTIC 3X8 NADH LF (GAUZE/BANDAGES/DRESSINGS) IMPLANT
DRSG MEPITEL 4X7.2 (GAUZE/BANDAGES/DRESSINGS) IMPLANT
ELECTRODE REM PT RTRN 9FT ADLT (ELECTROSURGICAL) ×1 IMPLANT
GAUZE PAD ABD 8X10 STRL (GAUZE/BANDAGES/DRESSINGS) IMPLANT
GAUZE SPONGE 4X4 12PLY STRL (GAUZE/BANDAGES/DRESSINGS) IMPLANT
GLOVE BIO SURGEON STRL SZ 6.5 (GLOVE) ×3 IMPLANT
GLOVE BIO SURGEON STRL SZ7.5 (GLOVE) ×3 IMPLANT
GLOVE BIOGEL PI IND STRL 6.5 (GLOVE) ×1 IMPLANT
GLOVE BIOGEL PI IND STRL 7.5 (GLOVE) ×1 IMPLANT
GOWN STRL REUS W/ TWL LRG LVL3 (GOWN DISPOSABLE) ×2 IMPLANT
KIT TURNOVER KIT B (KITS) ×1 IMPLANT
KWIRE FX150X1.6XTROC PNT (WIRE) IMPLANT
MANIFOLD NEPTUNE II (INSTRUMENTS) ×1 IMPLANT
NDL HYPO 25GX1X1/2 BEV (NEEDLE) ×1 IMPLANT
NEEDLE HYPO 25GX1X1/2 BEV (NEEDLE) ×1 IMPLANT
NS IRRIG 1000ML POUR BTL (IV SOLUTION) ×1 IMPLANT
PACK TOTAL JOINT (CUSTOM PROCEDURE TRAY) ×1 IMPLANT
PAD ARMBOARD POSITIONER FOAM (MISCELLANEOUS) ×2 IMPLANT
PAD CAST 4YDX4 CTTN HI CHSV (CAST SUPPLIES) IMPLANT
PADDING CAST COTTON 6X4 STRL (CAST SUPPLIES) IMPLANT
PLATE FIB EVOS 3H L 2.7X59 (Plate) IMPLANT
SCREW CORT 2.7X15 T8 ST EVOS (Screw) IMPLANT
SCREW CORT 2.7X16 STAR T8 EVOS (Screw) IMPLANT
SCREW CORT 2.7X17 ST T8 EVOS (Screw) IMPLANT
SCREW CORT 3.5X11 ST EVOS (Screw) IMPLANT
SCREW CORT 3.5X13MM (Screw) IMPLANT
SCREW CORT 3.5X14 ST EVOS (Screw) IMPLANT
SCREW CORT ST EVOS 3.5X55 (Screw) IMPLANT
SCREW CTX 3.5X50MM EVOS (Screw) IMPLANT
SET HNDPC FAN SPRY TIP SCT (DISPOSABLE) IMPLANT
SPLINT PLASTER CAST FAST 5X30 (CAST SUPPLIES) IMPLANT
STAPLER SKIN PROX 35W (STAPLE) ×1 IMPLANT
SUCTION TUBE FRAZIER 10FR DISP (SUCTIONS) ×1 IMPLANT
SUT ETHILON 3 0 PS 1 (SUTURE) ×2 IMPLANT
SUT MON AB 2-0 CT1 36 (SUTURE) IMPLANT
SYR CONTROL 10ML LL (SYRINGE) ×1 IMPLANT
TOWEL GREEN STERILE (TOWEL DISPOSABLE) ×2 IMPLANT
UNDERPAD 30X36 HEAVY ABSORB (UNDERPADS AND DIAPERS) ×1 IMPLANT
WATER STERILE IRR 1000ML POUR (IV SOLUTION) ×1 IMPLANT

## 2023-11-28 NOTE — ED Notes (Signed)
Pt being transferred to OR

## 2023-11-28 NOTE — Op Note (Signed)
 Orthopaedic Surgery Operative Note (CSN: [id number] ) Date of Surgery: 11/28/2023  Admit Date: 11/28/2023   Diagnoses: Pre-Op Diagnoses: Left open bimalleolar ankle fracture  Post-Op Diagnosis: Left type II open bimalleolar ankle fracture  Procedures: CPT 27814-Open reduction internal fixation of left bimalleolar ankle fracture CPT 11012-Irrigation and debridement of left open ankle fracture  Surgeons : Primary: Kendal Franky SQUIBB, MD  Assistant: Lauraine Moores, PA-C  Location: OR 3   Anesthesia: General   Antibiotics: Ancef  2g preop with 1 gm vancomycin  powder placed topically   Tourniquet time: None    Estimated Blood Loss: 25 mL  Complications: None   Specimens:* No specimens in log *   Implants: Implant Name Type Inv. Item Serial No. Manufacturer Lot No. LRB No. Used Action  SCREW CORT 3.5X14 ST EVOS - [id number] Screw SCREW CORT 3.5X14 ST EVOS  [employer]  Left 1 Implanted  SCREW CORT 3.5X11 ST EVOS - [id number] Screw SCREW CORT 3.5X11 ST EVOS  [employer]  Left 1 Implanted  SCREW CORT 2.7X15 T8 ST EVOS - [id number] Screw SCREW CORT 2.7X15 T8 ST EVOS  [employer]  Left 2 Implanted  SCREW CORT 2.7X16 STAR T8 EVOS - [id number] Screw SCREW CORT 2.7X16 STAR T8 EVOS  [employer]  Left 1 Implanted  PLATE FIB EVOS 3H L 2.7X59 - [id number] Plate PLATE FIB EVOS 3H L 2.7X59  [employer]  Left 1 Implanted  SCREW CORT ST EVOS 3.5X55 - [id number] Screw SCREW CORT ST EVOS 3.5X55  [employer]  Left 1 Implanted  SCREW CTX 3.5X50MM EVOS - [id number] Screw SCREW CTX 3.5X50MM EVOS  [employer]  Left 1 Implanted  SCREW CORT 2.7X17 ST T8 EVOS - [id number] Screw SCREW CORT 2.7X17 ST T8 EVOS  [employer]  Left 1 Implanted  SCREW CORT 3.5X13MM - [id number] Screw SCREW CORT 3.5X13MM  [employer]  Left 1 Implanted     Indications for  Surgery: 78 year old female who fell down the stairs sustaining a left open ankle fracture dislocation.  Due to the unstable nature of her injury I recommend proceeding with open reduction internal fixation and irrigation debridement of the left ankle.  Risks and benefits were discussed with the patient and her family.  Risks include but not limited to bleeding, infection, malunion, nonunion, hardware failure, hardware irritation, nerve or blood vessel injury, ankle stiffness, DVT, posttraumatic arthritis, even the possible anesthetic complications.  They agreed to proceed with surgery and consent was obtained.  Operative Findings: 1.  Irrigation and debridement of left type II open bimalleolar ankle fracture dislocation 2.  Open reduction internal fixation of left ankle fracture using [employer] EVOS 3.5/2.7 mm distal fibular locking plate and independent 3.5 millimeter screws for the medial malleolus fixation.  Procedure: The patient was identified in the preoperative holding area. Consent was confirmed with the patient and their family and all questions were answered. The operative extremity was marked after confirmation with the patient. she was then brought back to the operating room by our anesthesia colleagues.  She was placed under general anesthetic and carefully transferred over to radiolucent flattop table.  A bump was placed under her operative hip.  The left lower extremity was then prepped and draped in usual sterile fashion.  A timeout was performed to verify the patient, the procedure, and the extremity.  Preoperative antibiotics were dosed.  Fluoroscopic imaging showed the unstable nature of  her injury.  The distal tibia was delivered through the traumatic medial laceration.  There is no contamination.  I used debridement tools to remove any peripheral nonviable tissue.  I then used low-pressure pulsatile lavage to thoroughly irrigate the wound with 3 L of normal saline.  I then  changed gloves and instruments and reduced the ankle.  I was able to anatomically reduced the medial malleolus and held provisionally with a reduction tenaculum and a 1.6 mm K wire.  I then made a lateral incision over the distal fibula carried down through skin and subcutaneous tissue.  I then exposed the fracture site cleaned out the hematoma and reduced it anatomically.  I then chose a 3.5/2.7 mm distal fibular locking plate.  I placed a nonlocking screw distally and a nonlocking screw proximally.  I was able to get excellent fixation and proceeded to place nonlocking screws distally and another nonlocking screw proximally.  I then turned my attention to the medial side and was able to drill and placed 3.5 millimeter screws bicortically.  I then performed an external rotation stress view which showed no medial clear space widening.  I then placed 1 final 3.5 mm nonlocking screw proximally to complete the construct.  Final fluoroscopic imaging was obtained.  The incisions were copiously irrigated.  Gram vancomycin  powder split between the 2 wounds.  A layer closure of 2-0 Monocryl and 3-0 nylon for the lateral side and 3-0 nylon for the medial side.  Sterile dressings were applied.  A well-padded short leg splint was placed.  The patient was then awoke from anesthesia and taken to the PACU in stable condition.   Debridement type: Excisional Debridement  Side: left  Body Location: Ankle  Tools used for debridement: scalpel, curette, and rongeur  Pre-debridement Wound size (cm):   Length: 9        Width: 2.5     Depth: 1   Post-debridement Wound size (cm):  N/A  Debridement depth beyond dead/damaged tissue down to healthy viable tissue: yes  Tissue layer involved: skin, subcutaneous tissue, muscle / fascia, bone  Nature of tissue removed: Non-viable tissue  Irrigation volume: 3L     Irrigation fluid type: Normal Saline   Post Op Plan/Instructions: The patient be nonweightbearing to the left  lower extremity.  She will receive postoperative Ancef .  She will receive Lovenox  for DVT prophylaxis while inpatient and discharged on aspirin .  Will have her mobilize with physical and Occupational Therapy.  I was present and performed the entire surgery.  Lauraine Moores, PA-C did assist me throughout the case. An assistant was necessary given the difficulty in approach, maintenance of reduction and ability to instrument the fracture.   Franky Light, MD [facility]

## 2023-11-28 NOTE — Sedation Documentation (Signed)
 Pt being transported to OR , unable to get a aldrete scale before discharge since patient is leaving the ER and heading to OR. Immediately post procedure patient aldrete score was 8

## 2023-11-28 NOTE — Sedation Documentation (Addendum)
 1130 36.3 mg Propofol  given

## 2023-11-28 NOTE — ED Provider Notes (Signed)
 [place] [facility]GENC[facility] DEPARTMENT AT [facility] Provider Note   CSN: [id number] Arrival date & time: 11/28/23  9277     Patient presents with: [place]   Gina Arnold is a 78 y.o. female.    Fall  78 year old female with history of hypertension and reflux here with left ankle pain after tripping on the way down stairs she states she slid down the last several stairs twisting her ankle denies any head injury or loss of consciousness no nausea or vomiting EMS administered 2 g of Ancef  and 100 mics of fentanyl  and route.  She has a laceration that is approximately 6 cm located to her left medial ankle.  Pulses intact, sensation intact and able to wiggle toes.  No foot tenderness no other injuries of head, C, T, L-spine, upper extremities pelvis or right lower extremity.  Last tetanus was within 1 year from today    Prior to Admission medications   Medication Sig Start Date End Date Taking? Authorizing Provider  amLODipine  (NORVASC ) 5 MG tablet Take 1 tablet by mouth daily. 04/15/22   [provider]  b complex vitamins capsule Take 1 capsule by mouth daily.    [provider]  Calcium Carbonate 500 MG CHEW Chew by mouth.    [provider]  estradiol  (ESTRACE ) 0.1 MG/GM vaginal cream Apply a pea-sized amount to fingertip or Q-tip and wipe vaginal roof twice weekly 12/23/22   Stoioff, Scott C, MD  imipramine (TOFRANIL) 25 MG tablet Take 1 tablet by mouth at bedtime. 12/23/21 11/09/23  [provider]  magnesium  oxide (MAG-OX) 400 MG tablet Take 400 mg by mouth daily.    [provider]  melatonin 1 MG TABS tablet Take 1 mg by mouth at bedtime as needed.    [provider]  omeprazole  (PRILOSEC) 20 MG capsule TAKE 1 CAPSULE BY MOUTH TWICE A DAY BEFORE A MEAL 12/15/22   Therisa Bi, MD  oxybutynin (DITROPAN) 5 MG tablet Take 5 mg by mouth 3 (three) times daily. 08/27/20   [provider]  phentermine (ADIPEX-P) 37.5 MG tablet  Take 37.5 mg by mouth daily before breakfast. 09/23/21   [provider]  simvastatin  (ZOCOR ) 20 MG tablet Take 1 tablet by mouth at bedtime. 03/24/21   [provider]  topiramate (TOPAMAX) 50 MG tablet Take 50 mg by mouth daily.    [provider]  UNABLE TO FIND Take by mouth.    [provider]    Allergies: Hydrocodone-acetaminophen , Sulfasalazine, Ciprofloxacin, Codeine, Metronidazole, Sulfa antibiotics, and Tramadol    Review of Systems  Updated Vital Signs BP (!) 131/55   Pulse 66   Temp 97.9 F (36.6 C) (Oral)   Resp 18   Ht 5' 2.5 (1.588 m)   Wt 72.5 kg   SpO2 100%   BMI 28.77 kg/m   Physical Exam Vitals and nursing note reviewed.  Constitutional:      General: She is not in acute distress. HENT:     Head: Normocephalic and atraumatic.     Nose: Nose normal.     Mouth/Throat:     Mouth: Mucous membranes are moist.  Eyes:     General: No scleral icterus. Cardiovascular:     Rate and Rhythm: Normal rate and regular rhythm.     Pulses: Normal pulses.     Heart sounds: Normal heart sounds.     Comments: DP PT pulse symmetric Pulmonary:     Effort: Pulmonary effort is normal. No  respiratory distress.     Breath sounds: No wheezing.  Abdominal:     Palpations: Abdomen is soft.     Tenderness: There is no abdominal tenderness. There is no guarding or rebound.  Musculoskeletal:     Cervical back: Normal range of motion.     Right lower leg: No edema.     Left lower leg: No edema.  Skin:    General: Skin is warm and dry.     Capillary Refill: Capillary refill takes less than 2 seconds.     Comments: Deep laceration to the medial left ankle  Neurological:     Mental Status: She is alert. Mental status is at baseline.  Psychiatric:        Mood and Affect: Mood normal.        Behavior: Behavior normal.        (all labs ordered are listed, but only abnormal results are displayed) Labs Reviewed  CBC - Abnormal; Notable for  the following components:      Result Value   Hemoglobin 11.8 (*)    HCT 34.5 (*)    All other components within normal limits  BASIC METABOLIC PANEL WITH GFR - Abnormal; Notable for the following components:   CO2 21 (*)    Glucose, Bld 126 (*)    Calcium 8.6 (*)    All other components within normal limits  SURGICAL PCR SCREEN    EKG: None  Radiology: DG Ankle Left Port Result Date: 11/28/2023 CLINICAL DATA:  Post reduction left ankle fracture. EXAM: PORTABLE LEFT ANKLE - 2 VIEW COMPARISON:  Same day at 0806 hours. FINDINGS: Interval reduction of previously seen bimalleolar ankle fracture dislocation. Ankle mortise is in near anatomic alignment with probable slight widening of the talofibular joint. Minimally displaced fractures of the medial and lateral malleoli, as before. Fiberglass splint in place. IMPRESSION: Interval reduction of a bimalleolar ankle fracture dislocation with near complete restoration of anatomic alignment. Electronically Signed   By: Newell Eke M.D.   On: 11/28/2023 12:41   DG Ankle Complete Left Result Date: 11/28/2023 CLINICAL DATA:  Fall, deep laceration to medial ankle. EXAM: LEFT ANKLE COMPLETE - 3+ VIEW COMPARISON:  None Available. FINDINGS: There are acute mild-to-moderately displaced fractures of the medial and lateral malleoli with associated disruption of the ankle mortise. There is also probable avulsed fracture fragment adjacent to the calcaneocuboid joint seen on the lateral film which may represent additional fracture. No other acute fracture or dislocation. No aggressive osseous lesion. Calcaneal spur noted along the Achilles tendon attachment site. No focal soft tissue swelling. No radiopaque foreign bodies. IMPRESSION: *Acute fractures of the medial and lateral malleoli with associated disruption of the ankle mortise. Probable avulsed fracture fragment adjacent to the calcaneocuboid joint. Electronically Signed   By: Ree Molt M.D.   On:  11/28/2023 08:21     .Critical Care  Performed by: Neldon Hamp RAMAN, PA Authorized by: Neldon Hamp RAMAN, PA   Critical care provider statement:    Critical care time (minutes):  35   Critical care time was exclusive of:  Separately billable procedures and treating other patients and teaching time   Critical care was necessary to treat or prevent imminent or life-threatening deterioration of the following conditions: Open fracture requiring emergent reduction.   Critical care was time spent personally by me on the following activities:  Development of treatment plan with patient or surrogate, review of old charts, re-evaluation of patient's condition, pulse oximetry, ordering and review  of radiographic studies, ordering and review of laboratory studies, ordering and performing treatments and interventions, obtaining history from patient or surrogate, examination of patient and evaluation of patient's response to treatment   Care discussed with: admitting provider   .Splint Application  Date/Time: 11/28/2023 1:45 PM  Performed by: Neldon Hamp RAMAN, PA Authorized by: Neldon Hamp RAMAN, PA   Consent:    Consent obtained:  Verbal   Consent given by:  Patient   Risks, benefits, and alternatives were discussed: yes     Risks discussed:  Discoloration, numbness, pain and swelling   Alternatives discussed:  No treatment Universal protocol:    Procedure explained and questions answered to patient or proxy's satisfaction: yes     Relevant documents present and verified: yes     Test results available: yes     Imaging studies available: yes     Required blood products, implants, devices, and special equipment available: yes     Site/side marked: yes     Immediately prior to procedure a time out was called: yes     Patient identity confirmed:  Verbally with patient and arm band Pre-procedure details:    Distal neurologic exam:  Normal   Distal perfusion: distal pulses strong and brisk capillary  refill   Procedure details:    Location:  Ankle   Ankle location:  L ankle   Splint type:  Short leg   Supplies:  Fiberglass   Attestation: Splint applied and adjusted personally by me (Slight inversion, traction and pressure at the lateral malleolus)   Post-procedure details:    Distal neurologic exam:  Normal   Distal perfusion: brisk capillary refill     Procedure completion:  Tolerated   Post-procedure imaging: reviewed   Comments:     Post reduction:   IMPRESSION: Interval reduction of a bimalleolar ankle fracture dislocation with near complete restoration of anatomic alignment.  .Reduction of fracture  Date/Time: 11/28/2023 1:46 PM  Performed by: Neldon Hamp RAMAN, PA Authorized by: Neldon Hamp RAMAN, PA  Consent: Verbal consent obtained Risks and benefits: risks, benefits and alternatives were discussed Consent given by: patient Patient understanding: patient states understanding of the procedure being performed Patient consent: the patient's understanding of the procedure matches consent given Relevant documents: relevant documents present and verified Test results: test results available and properly labeled Imaging studies: imaging studies available Patient identity confirmed: verbally with patient and arm band Local anesthesia used: no  Anesthesia: Local anesthesia used: no  Sedation: Patient sedated: no  Patient tolerance: patient tolerated the procedure well with no immediate complications Comments: Left ankle with bimalleolar fracture which was reduced under conscious sedation with Dr. Ismael.  Propofol  was used for sedation.  Patient tolerated procedure well.  Postreduction x-ray was reviewed by me      Medications Ordered in the ED  chlorhexidine  (HIBICLENS ) 4 % liquid 4 Application (has no administration in time range)  povidone-iodine  10 % swab 2 Application (has no administration in time range)  enoxaparin  (LOVENOX ) injection 40 mg ( Subcutaneous  Automatically Held 12/06/23 2200)  lactated ringers  infusion ( Intravenous Continued from Pre-op 11/28/23 1322)  HYDROmorphone  (DILAUDID ) injection 0.5 mg ( Intravenous MAR Hold 11/28/23 1224)  fentaNYL  (SUBLIMAZE ) injection 100 mcg (100 mcg Intravenous Given 11/28/23 0741)  propofol  (DIPRIVAN ) 10 mg/mL bolus/IV push 36.3 mg (36.3 mg Intravenous Given 11/28/23 1130)  acetaminophen  (TYLENOL ) tablet 1,000 mg (1,000 mg Oral Given 11/28/23 1304)  ceFAZolin  (ANCEF ) IVPB 2g/100 mL premix (2 g Intravenous Given 11/28/23 1336)  chlorhexidine  (PERIDEX ) 0.12 % solution 15 mL (15 mLs Mouth/Throat Given 11/28/23 1301)    Or  Oral care mouth rinse ( Mouth Rinse See Alternative 11/28/23 1301)    Clinical Course as of 11/28/23 1345  Mon Nov 28, 2023  0830 Discussed with Gina Arnold who will eval [WF]  1005 Pressure below lateral malleolus  [WF]    Clinical Course User Index [WF] Neldon Hamp RAMAN, PA                                  Medical Decision Making Amount and/or Complexity of Data Reviewed Labs: ordered. Radiology: ordered.  Risk Prescription drug management. Decision regarding hospitalization.   This patient presents to the ED for concern of ankle pain, this involves a number of treatment options, and is a complaint that carries with it a moderate risk of complications and morbidity. A differential diagnosis was considered for the patient's symptoms which is discussed below:   Arterial injury, DVT, fracture, cellulitis, abscess, other traumatic injury including compartment syndrome.   Co morbidities: Discussed in HPI   Brief History:  78 year old female here with left ankle pain after tripping on the way down stairs she states she slid down the last several stairs twisting her ankle denies any head injury or loss of consciousness no nausea or vomiting EMS administered 2 g of Ancef  and 100 mics of fentanyl  and route.  She has a laceration that is approximately 6 cm located to her left medial  ankle.  Pulses intact, sensation intact and able to wiggle toes.  No foot tenderness no other injuries of head, C, T, L-spine, upper extremities pelvis or right lower extremity.    EMR reviewed including pt PMHx, past surgical history and past visits to ER.   See HPI for more details   Lab Tests:   Labs unremarkable   Imaging Studies:  Abnormal findings. I personally reviewed all imaging studies. Imaging notable for bimalleolar fracture with partial dislocation  POST REDUCTION IMPRESSION:  Interval reduction of a bimalleolar ankle fracture dislocation with  near complete restoration of anatomic alignment.    Cardiac Monitoring:  The patient was maintained on a cardiac monitor.  I personally viewed and interpreted the cardiac monitored which showed an underlying rhythm of: NSR EKG non-ischemic   Medicines ordered:  I ordered medication including Ancef , Tylenol , propofol , fentanyl , lactated Ringer's for pain, antibiosis for open fracture Reevaluation of the patient after these medicines showed that the patient improved I have reviewed the patients home medicines and have made adjustments as needed   Critical Interventions:     Consults/Attending Physician   I requested consultation with orthopedic PA Gina Arnold,  and discussed lab and imaging findings as well as pertinent plan - they recommend: Admission to medicine, reduction, splint   Reevaluation:  After the interventions noted above I re-evaluated patient and found that they have :improved   Social Determinants of Health:      Problem List / ED Course:  Bimalleolar fracture.  This was a open fracture.  Required OR washout and fixation.  Procedural sedation was completed to reduce ankle fracture and dislocation while awaiting OR.   Dispostion:  After consideration of the diagnostic results and the patients response to treatment, I feel that the patent would benefit from admission   Final  diagnoses:  Fall, initial encounter  Type I or II open bimalleolar fracture of left ankle, initial encounter  ED Discharge Orders     None          Neldon Hamp RAMAN, [place] 11/28/23 1347    Armenta Canning, MD 11/28/23 1655

## 2023-11-28 NOTE — Sedation Documentation (Signed)
10 mg propofol given

## 2023-11-28 NOTE — Anesthesia Procedure Notes (Signed)
 Procedure Name: Intubation Date/Time: 11/28/2023 1:31 PM  Performed by: Sherlyn Lapine, CRNAPre-anesthesia Checklist: Patient identified, Emergency Drugs available, Suction available and Patient being monitored Patient Re-evaluated:Patient Re-evaluated prior to induction Oxygen Delivery Method: Circle System Utilized Preoxygenation: Pre-oxygenation with 100% oxygen Induction Type: IV induction Ventilation: Mask ventilation without difficulty Laryngoscope Size: Miller and 2 Grade View: Grade I Tube type: Oral Tube size: 7.0 mm Number of attempts: 1 Airway Equipment and Method: Stylet and Oral airway Placement Confirmation: ETT inserted through vocal cords under direct vision, positive ETCO2 and breath sounds checked- equal and bilateral Tube secured with: Tape Dental Injury: Teeth and Oropharynx as per pre-operative assessment

## 2023-11-28 NOTE — Consult Note (Signed)
 Reason for Consult:Open left ankle fx Referring Physician: Ludivina Shines Time called: 9173 Time at bedside: 1026   Gina Arnold Badgett is an 78 y.o. female.  HPI: [place] tripped coming down her stairs and fell down 4-5 steps. She had immediate left ankle pain and could not bear weight. She also noticed a large wound. She was brought to the ED where x-rays showed a left ankle fx and orthopedic surgery was consulted. She lives at home with her husband, does not use any assistive devices to ambulate, and is retired.  Past Medical History:  Diagnosis Date   GERD (gastroesophageal reflux disease)    Hypertension     Past Surgical History:  Procedure Laterality Date   ABDOMINAL HYSTERECTOMY     APPENDECTOMY     CHOLECYSTECTOMY     ESOPHAGOGASTRODUODENOSCOPY (EGD) WITH PROPOFOL  N/A 09/01/2022   Procedure: ESOPHAGOGASTRODUODENOSCOPY (EGD) WITH PROPOFOL ;  Surgeon: Therisa Bi, MD;  Location[facility] END[facility]COPY;  Service: Gastroenterology;  Laterality: N/A;   EYE SURGERY     FLEXIBLE SIGMOIDOSCOPY N/A 09/01/2022   Procedure: FLEXIBLE SIGMOIDOSCOPY;  Surgeon: Therisa Bi, MD;  Location: [facility] ENDOSCOPY;  Service: Gastroenterology;  Laterality: N/A;   TONSILLECTOMY      History reviewed. No pertinent family history.  Social History:  reports that she quit smoking about 52 years ago. Her smoking use included cigarettes. She has never used smokeless tobacco. She reports current alcohol use. She reports that she does not use drugs.  Allergies:  Allergies  Allergen Reactions   Hydrocodone-Acetaminophen  Itching and Other (See Comments)    unsure   Sulfasalazine Nausea And Vomiting    Decreases WBC   Ciprofloxacin Nausea And Vomiting and Nausea Only    Exhaustion and indigestion   Codeine Itching   Metronidazole Nausea And Vomiting and Nausea Only    Exhaustion and indigestion   Sulfa Antibiotics Other (See Comments)    Decreases WBC  Decreased WBC  Decreased wbcs   Tramadol Nausea And  Vomiting    Other Reaction(s): Other (See Comments)  GI Upset    Medications: I have reviewed the patient's current medications.  Results for orders placed or performed during the hospital encounter of 11/28/23 (from the past 48 hours)  CBC     Status: Abnormal   Collection Time: 11/28/23  7:39 AM  Result Value Ref Range   WBC 6.8 4.0 - 10.5 K/uL   RBC 3.99 3.87 - 5.11 MIL/uL   Hemoglobin 11.8 (L) 12.0 - 15.0 g/dL   HCT 65.4 (L) 63.9 - 53.9 %   MCV 86.5 80.0 - 100.0 fL   MCH 29.6 26.0 - 34.0 pg   MCHC 34.2 30.0 - 36.0 g/dL   RDW 87.3 88.4 - 84.4 %   Platelets 180 150 - 400 K/uL   nRBC 0.0 0.0 - 0.2 %    Comment: Performed at [facility], [address] [place], [place] [address]  Basic metabolic panel     Status: Abnormal   Collection Time: 11/28/23  7:39 AM  Result Value Ref Range   Sodium 141 135 - 145 mmol/L   Potassium 3.6 3.5 - 5.1 mmol/L   Chloride 108 98 - 111 mmol/L   CO2 21 (L) 22 - 32 mmol/L   Glucose, Bld 126 (H) 70 - 99 mg/dL    Comment: Glucose reference range applies only to samples taken after fasting for at least 8 hours.   BUN 19 8 - 23 mg/dL   Creatinine, Ser 9.22 0.44 - 1.00  mg/dL   Calcium 8.6 (L) 8.9 - 10.3 mg/dL   GFR, Estimated >39 >39 mL/min    Comment: (NOTE) Calculated using the CKD-EPI Creatinine Equation (2021)    Anion gap 12 5 - 15    Comment: Performed at [facility], [address] [place], [place] [address]    DG Ankle Complete Left Result Date: 11/28/2023 CLINICAL DATA:  Fall, deep laceration to medial ankle. EXAM: LEFT ANKLE COMPLETE - 3+ VIEW COMPARISON:  None Available. FINDINGS: There are acute mild-to-moderately displaced fractures of the medial and lateral malleoli with associated disruption of the ankle mortise. There is also probable avulsed fracture fragment adjacent to the calcaneocuboid joint seen on the lateral film which may represent additional fracture. No other acute fracture or dislocation. No aggressive  osseous lesion. Calcaneal spur noted along the Achilles tendon attachment site. No focal soft tissue swelling. No radiopaque foreign bodies. IMPRESSION: *Acute fractures of the medial and lateral malleoli with associated disruption of the ankle mortise. Probable avulsed fracture fragment adjacent to the calcaneocuboid joint. Electronically Signed   By: Ree Molt M.D.   On: 11/28/2023 08:21    Review of Systems  HENT:  Negative for ear discharge, ear pain, hearing loss and tinnitus.   Eyes:  Negative for photophobia and pain.  Respiratory:  Negative for cough and shortness of breath.   Cardiovascular:  Negative for chest pain.  Gastrointestinal:  Negative for abdominal pain, nausea and vomiting.  Genitourinary:  Negative for dysuria, flank pain, frequency and urgency.  Musculoskeletal:  Positive for arthralgias (Left ankle). Negative for back pain, myalgias and neck pain.  Neurological:  Negative for dizziness and headaches.  Hematological:  Does not bruise/bleed easily.  Psychiatric/Behavioral:  The patient is not nervous/anxious.    Blood pressure (!) 128/59, pulse 67, temperature 97.6 F (36.4 C), temperature source Oral, resp. rate 15, height 5' 2.5 (1.588 m), weight 72.6 kg, SpO2 100%. Physical Exam Constitutional:      General: She is not in acute distress.    Appearance: She is well-developed. She is not diaphoretic.  HENT:     Head: Normocephalic and atraumatic.  Eyes:     General: No scleral icterus.       Right eye: No discharge.        Left eye: No discharge.     Conjunctiva/sclera: Conjunctivae normal.  Cardiovascular:     Rate and Rhythm: Normal rate and regular rhythm.  Pulmonary:     Effort: Pulmonary effort is normal. No respiratory distress.  Musculoskeletal:     Cervical back: Normal range of motion.     Comments: LLE No traumatic wounds, ecchymosis, or rash  Mod TTP ankle, transverse laceration medial aspect  No knee effusion  Knee stable to varus/ valgus  and anterior/posterior stress  Sens DPN, SPN, TN intact  Motor EHL 5/5  DP 2+, No significant edema  Skin:    General: Skin is warm and dry.  Neurological:     Mental Status: She is alert.  Psychiatric:        Mood and Affect: Mood normal.        Behavior: Behavior normal.     Assessment/Plan: Open left ankle fx -- Plan I&D, ex fix vs ORIF today with Dr. Kendal. Please keep NPO.    Ozell DOROTHA Ned, PA-C Orthopedic Surgery [phone] 11/28/2023, 10:33 AM

## 2023-11-28 NOTE — Hospital Course (Signed)
 Last fall was June 2025 when she was trying to kill a fly and fell but didn't hit head and didn't require hospitalization  This morning, she woke up , feeling well and decided to go downstairs to use the bathroom and she slipped on the stairs and fell about 5 stairs. He later held on the rails. She was able to get up but she felt some pain around her right ankle. She later called her daughter  and her husband to call the ems to bring her in.   She denies any dizziness, heart palpitations or confusion before and after the fall. Haven't had any new medication. Denies N/V , fever , cough . She is eating and drinking okay with normal stool color. Denies any weakness.   Report 8/10 pain now. Says pain was about a 4 after she had fentanyl  but it seems to have worn off.

## 2023-11-28 NOTE — Anesthesia Postprocedure Evaluation (Signed)
 Anesthesia Post Note  Patient: Gina Arnold  Procedure(s) Performed: OPEN REDUCTION INTERNAL FIXATION (ORIF) ANKLE FRACTURE (Left: Ankle)     Patient location during evaluation: PACU Anesthesia Type: General Level of consciousness: awake and alert Pain management: pain level controlled Vital Signs Assessment: post-procedure vital signs reviewed and stable Respiratory status: spontaneous breathing, nonlabored ventilation and respiratory function stable Cardiovascular status: stable and blood pressure returned to baseline Anesthetic complications: no   No notable events documented.  Last Vitals:  Vitals:   11/28/23 1600 11/28/23 1622  BP: 138/64 (!) 148/70  Pulse: 69 68  Resp: 11 12  Temp:  36.4 C  SpO2: 100% 100%    Last Pain:  Vitals:   11/28/23 1719  TempSrc:   PainSc: 6                  Debby FORBES Like

## 2023-11-28 NOTE — Transfer of Care (Signed)
 Immediate Anesthesia Transfer of Care Note  Patient: Gina Arnold  Procedure(s) Performed: OPEN REDUCTION INTERNAL FIXATION (ORIF) ANKLE FRACTURE (Left: Ankle)  Patient Location: PACU  Anesthesia Type:General  Level of Consciousness: awake and alert   Airway & Oxygen Therapy: Patient Spontanous Breathing  Post-op Assessment: Report given to RN and Post -op Vital signs reviewed and stable  Post vital signs: Reviewed and stable  Last Vitals:  Vitals Value Taken Time  BP 131/68 11/28/23 15:00  Temp    Pulse 85 11/28/23 15:02  Resp 23 11/28/23 15:02  SpO2 95 % 11/28/23 15:02  Vitals shown include unfiled device data.  Last Pain:  Vitals:   11/28/23 1259  TempSrc:   PainSc: 6          Complications: No notable events documented.

## 2023-11-28 NOTE — Progress Notes (Signed)
 Orthopedic Tech Progress Note Patient Details:  Gina Arnold 11-09-1945 [id number]  Ortho Devices Type of Ortho Device: Cotton web roll, Ace wrap, Short leg splint, Stirrup splint Ortho Device/Splint Location: LLE Ortho Device/Splint Interventions: Ordered, Application, Adjustment   Post Interventions Patient Tolerated: Well Instructions Provided: Care of device  Delanna LITTIE Pac 11/28/2023, 11:43 AM

## 2023-11-28 NOTE — ED Triage Notes (Addendum)
 Pt arrived via [facility] from home c/o fall down the stairs (approximately 15 ft). Fire reported that there appeared to be an open fracture of her ankle. Pedal pulses intact and no thinners.   145/62 80 18RR 2g ancef  100mcg fent 4 mg zofran 

## 2023-11-28 NOTE — H&P (Cosign Needed Addendum)
 Date: 11/28/2023               Patient Name:  Gina Arnold MRN: [id number]  DOB: [birth year] Age / Sex: 78 y.o., female   PCP: Auston Reyes BIRCH, MD         Medical Service: Internal Medicine Teaching Service         Attending Physician: Dr. Dayton Eastern      First Contact: Letha Cheadle, MD}    Second Contact: Dr. 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 She was brought to the emergency department by EMS following a fall earlier this morning.  The patient states that she woke up feeling well and decided to go downstairs to use the bathroom. While descending, she slipped and fell down approximately 3-4 steps. She was able to hold onto the handrails and get herself up. Shortly after standing, she experienced immediate pain around her right ankle, which prompted her to contact her daughter and husband, who then brought her to the hospital.  She recalls her previous fall occurring in June 2025, when she slipped while trying to swat a fly. That incident did not result in any injury or hospitalization, and she did not hit her head.  At baseline, the patient is fully independent in ambulation without the need for assistance or a walker. She has a history of right eye cataracts but denies any recent changes in vision. She reports no dizziness, palpitations, or confusion either before or after the fall. Additionally, she has had no medication changes in the past year. She denies any weakness, loss of appetite, or urinary or bowel symptoms.  She also denied any recent use of alcohol or any illicit drugs or medications that are centrally acting.  ED Course: Labs  significant for Hgb of 11.8 ,normal platelets of 180 and no leukocytosis. Imaging : Left ankle complete XR showed Acute fractures of the medial and lateral malleoli with associated disruption of the ankle mortise. Probable avulsed fracture fragment adjacent to the calcaneocuboid joint. Received : 1 dose of fentanyl  for pain control Consulted : Orthopedic surgery  Meds:  Patient reported: Amlodipine  5 mg daily Calcium carbonate 500 mg daily Estradiol  vaginal cream as needed Imipramine 25 mg at bedtime Magnesium  oxide 400 mg daily Melatonin at night Omeprazole  20 mg BID Simvastatin  20 mg at bedtime Oxybutynin 5 mg 3 times daily Topiramate 50 mg daily  Past Medical History Fall Hypertension GERD Hyperlipidemia Stress incontinence Chronic back pain  Past Surgical History Abdominal hysterectomy Cataract surgery of the right eye in 2024  Social:  Lives With: Husband and daughter 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 No pertinent family history.   Allergies: Hydrocodone/acetaminophen  itching Metronidazole Sulfa antibiotics Ciprofloxacin Tramadol   Review of Systems: A complete ROS was negative except as per HPI.   OBJECTIVE:   Physical Exam: Blood pressure (!) 128/59, pulse 67, temperature 97.6 F (36.4 C), temperature source Oral, resp. rate 15, height 5' 2.5 (1.588 m), weight 72.6 kg, SpO2 100%.  Constitutional: Well-appearing female, in mild distress, on room air. HENT: No bruising no evidence of trauma Eyes: conjunctiva non-erythematous Cardiovascular: regular rate and rhythm, no m/r/g Pulmonary/Chest: normal work of breathing on room air, lungs clear to auscultation bilaterally Abdominal: soft, non-tender, non-distended MSK: The Left ankle was placed in a  splint with a dry dressing applied. Two new bruises were noted on the left leg. Wrapping was deferred to ensure the patient's comfort. Please refer to the attached images of the laceration on the medial aspect of the ankle for further evaluation. DT pulses are intact and symmetric  Neurological: alert & oriented x 3, 5/5 strength in bilateral upper . Gait assessment lower extremity strength deferred for patient's comfort Skin: warm and dry Psych: Normal mood and affect  Labs: CBC    Component Value Date/Time   WBC 6.8 11/28/2023 0739   RBC 3.99 11/28/2023 0739   HGB 11.8 (L) 11/28/2023 0739   HCT 34.5 (L) 11/28/2023 0739   PLT 180 11/28/2023 0739   MCV 86.5 11/28/2023 0739   MCH 29.6 11/28/2023 0739   MCHC 34.2 11/28/2023 0739   RDW 12.6 11/28/2023 0739     CMP     Component Value Date/Time   NA 141 11/28/2023 0739   K 3.6 11/28/2023 0739   CL 108 11/28/2023 0739   CO2 21 (L) 11/28/2023 0739   GLUCOSE 126 (H) 11/28/2023 0739   BUN 19 11/28/2023 0739   CREATININE 0.77 11/28/2023 0739   CALCIUM 8.6 (L) 11/28/2023 0739   PROT 7.2 05/30/2019 1929   ALBUMIN 3.9 05/30/2019 1929   AST 48 (H) 05/30/2019 1929   ALT 57 (H) 05/30/2019 1929   ALKPHOS 61 05/30/2019 1929   BILITOT 0.9 05/30/2019 1929   GFRNONAA >60 11/28/2023 0739   GFRAA >60 05/30/2019 1929    Imaging: DG Ankle Left Port Result Date: 11/28/2023 CLINICAL DATA:  Post reduction left ankle fracture. EXAM: PORTABLE LEFT ANKLE - 2 VIEW COMPARISON:  Same day at 0806 hours. FINDINGS: Interval reduction of previously seen bimalleolar ankle fracture dislocation. Ankle mortise is in near anatomic alignment with probable slight widening of the talofibular joint. Minimally displaced fractures of the medial and lateral malleoli, as before. Fiberglass splint in place. IMPRESSION: Interval reduction of a bimalleolar ankle fracture dislocation with near complete restoration of anatomic alignment. Electronically Signed   By: Newell Eke M.D.   On: 11/28/2023 12:41   DG Ankle Complete Left Result Date: 11/28/2023 CLINICAL DATA:  Fall, deep laceration to medial ankle. EXAM: LEFT ANKLE COMPLETE - 3+ VIEW COMPARISON:  None Available. FINDINGS: There are acute mild-to-moderately displaced fractures of the medial and lateral malleoli with associated disruption of the ankle mortise. There is also probable avulsed fracture fragment adjacent to the calcaneocuboid joint seen on the lateral film which may represent additional fracture. No other acute fracture or dislocation. No aggressive osseous lesion. Calcaneal spur noted along the Achilles tendon attachment site. No focal soft tissue swelling. No radiopaque foreign bodies. IMPRESSION: *Acute fractures of the medial and lateral malleoli with associated disruption of the ankle mortise. Probable avulsed fracture fragment adjacent to the calcaneocuboid joint. Electronically Signed   By: Ree Molt M.D.   On: 11/28/2023 08:21     ASSESSMENT & PLAN:   Assessment & Plan by Problem: Principal Problem:   Ankle fracture, left   MALAIYAH ACHORN is a 77 y.o. person living with a history of fall, chest incontinence and hypertension who presented after a fall and admitted for  ankle fracture, pending Ex-Fix versus ORIF with orthopedics today.  #Acute fracture of the medial and lateral malleoli with distraction to the ankle mortise # Fall Ms. Prunty presented following a fall, with imaging revealing an acute fracture of both the medial and lateral malleoli, along with distraction of the ankle mortise. She is scheduled for orthopedic surgery this afternoon. In the meantime, we will manage her pain. Her previous fall in June 2025 occurred while trying to swat a fly and slipping. Although both falls appear to be mechanical, I remain concerned about underlying factors such as balance and gait abnormalities, muscle weakness, medication effects, and physical deconditioning, especially given her  history of vitamin D  deficiency. Environmental factors, including poor lighting or cluttered floors at home, may also contribute. The absence of dizziness, confusion, weakness, or systemic signs of infection is reassuring, making cardiovascular, neurological, or infectious causes less likely. Her hemoglobin is 11.8 making anemia less likely.She reported taking  phentermine, which can cause cardiovascular side effects such as palpitations, although she has not reported any symptoms. However, this medication is not on her dispense history. Additionally, her stress urinary incontinence may have contributed to her urgency and rushing to the bathroom, increasing the risk of slipping and falling.Based on the circumstances surrounding both her previous and current falls, I believe Ms. Langner would greatly benefit from a multifactorial risk assessment and tailored interventions following discharge. - Pain Control with IV dilaudid  - ORIF with Ortho - Follow up on post-op rec - Monitor vital signs - PT/OT post op  - Fall precautions - Consider counseling her to switch to GLP1 for weight loss instead of Topiramate and phentermine   #Anemia Hgb of 11.8 <<  13.9 from 4 years ago. No evidence of bleeding  - Will trend Hgb while she is hospitalized - Transfuse protocol ,goal hgb > 7   #Hypertension - BP of 130/64. On home Amlodipine  5 mg daily.  Will resume after her procedure   #GERD - Resume omeprazole  after procedure  # Hyperlipidemia -  Resume simvastatin  after procedure   # Stress incontinence -  Hold oxybutynin in the setting on this acute fall. Reassess benefits vs risks before resuming on discharge   #Meningioma  A 1.5 cm extra-axial mass over the anterior left frontal lobe, likely a meningioma, was identified on a CT head scan in June 2025. Follow-up MRI of the head demonstrated stability of the lesion. The patient has been evaluated by neurosurgery, who recommended repeating the MRI in 6 to  12 months. Given the stability of the mass and the patient's current lack of headaches or neurological symptoms, I have a low suspicion that this lesion contributed to her fall   Best practice: Diet: NPO VTE: Enoxaparin  IVF: None,None Code: Full  Disposition planning: Prior to Admission Living Arrangement: Home, living with husband and daughter  Anticipated Discharge Location: Pending post op- recs   Dispo: Admit patient to Inpatient with expected length of stay greater than 2 midnights.  Signed: Renne Homans, MD Internal Medicine Resident  11/28/2023, 12:47 PM  On Call pager: [phone]

## 2023-11-28 NOTE — ED Notes (Signed)
 Pt transported to xray

## 2023-11-28 NOTE — Progress Notes (Signed)
 Orthopedic Tech Progress Note Patient Details:  Gina Arnold 1945/10/27 [id number]  Went to apply splint but patient was in pain, I left materials so I can splint, but PA stated patient needed wound care before splinting, RN was tied up with another patient, notified to call [facility] when finish (husband at bedside)  Patient ID: Gina Arnold, female   DOB: 1945-07-11, 78 y.o.   MRN: [id number]  Gina Arnold 11/28/2023, 11:02 AM

## 2023-11-28 NOTE — H&P (View-Only) (Signed)
 Reason for Consult:Open left ankle fx Referring Physician: Ludivina Shines Time called: [id number] Time at bedside: 1026   Gina Arnold is an 78 y.o. female.  HPI: Gina tripped coming down her stairs and fell down 4-5 steps. She had immediate left ankle pain and could not bear weight. She also noticed a large wound. She was brought to the ED where x-rays showed a left ankle fx and orthopedic surgery was consulted. She lives at home with her husband, does not use any assistive devices to ambulate, and is retired.  Past Medical History:  Diagnosis Date   GERD (gastroesophageal reflux disease)    Hypertension     Past Surgical History:  Procedure Laterality Date   ABDOMINAL HYSTERECTOMY     APPENDECTOMY     CHOLECYSTECTOMY     ESOPHAGOGASTRODUODENOSCOPY (EGD) WITH PROPOFOL  N/A 09/01/2022   Procedure: ESOPHAGOGASTRODUODENOSCOPY (EGD) WITH PROPOFOL ;  Surgeon: Therisa Bi, MD;  Location[facility] END[facility]COPY;  Service: Gastroenterology;  Laterality: N/A;   EYE SURGERY     FLEXIBLE SIGMOIDOSCOPY N/A 09/01/2022   Procedure: FLEXIBLE SIGMOIDOSCOPY;  Surgeon: Therisa Bi, MD;  Location: [facility] ENDOSCOPY;  Service: Gastroenterology;  Laterality: N/A;   TONSILLECTOMY      History reviewed. No pertinent family history.  Social History:  reports that she quit smoking about 52 years ago. Her smoking use included cigarettes. She has never used smokeless tobacco. She reports current alcohol use. She reports that she does not use drugs.  Allergies:  Allergies  Allergen Reactions   Hydrocodone-Acetaminophen  Itching and Other (See Comments)    unsure   Sulfasalazine Nausea And Vomiting    Decreases WBC   Ciprofloxacin Nausea And Vomiting and Nausea Only    Exhaustion and indigestion   Codeine Itching   Metronidazole Nausea And Vomiting and Nausea Only    Exhaustion and indigestion   Sulfa Antibiotics Other (See Comments)    Decreases WBC  Decreased WBC  Decreased wbcs   Tramadol Nausea And  Vomiting    Other Reaction(s): Other (See Comments)  GI Upset    Medications: I have reviewed the patient's current medications.  Results for orders placed or performed during the hospital encounter of 11/28/23 (from the past 48 hours)  CBC     Status: Abnormal   Collection Time: 11/28/23  7:39 AM  Result Value Ref Range   WBC 6.8 4.0 - 10.5 K/uL   RBC 3.99 3.87 - 5.11 MIL/uL   Hemoglobin 11.8 (L) 12.0 - 15.0 g/dL   HCT 65.4 (L) 63.9 - 53.9 %   MCV 86.5 80.0 - 100.0 fL   MCH 29.6 26.0 - 34.0 pg   MCHC 34.2 30.0 - 36.0 g/dL   RDW 87.3 88.4 - 84.4 %   Platelets 180 150 - 400 K/uL   nRBC 0.0 0.0 - 0.2 %    Comment: Performed at [facility], [address] [place], [place] [address]  Basic metabolic panel     Status: Abnormal   Collection Time: 11/28/23  7:39 AM  Result Value Ref Range   Sodium 141 135 - 145 mmol/L   Potassium 3.6 3.5 - 5.1 mmol/L   Chloride 108 98 - 111 mmol/L   CO2 21 (L) 22 - 32 mmol/L   Glucose, Bld 126 (H) 70 - 99 mg/dL    Comment: Glucose reference range applies only to samples taken after fasting for at least 8 hours.   BUN 19 8 - 23 mg/dL   Creatinine, Ser 9.22 0.44 - 1.00  mg/dL   Calcium 8.6 (L) 8.9 - 10.3 mg/dL   GFR, Estimated >39 >39 mL/min    Comment: (NOTE) Calculated using the CKD-EPI Creatinine Equation (2021)    Anion gap 12 5 - 15    Comment: Performed at [facility], [address] [place], [place] [address]    DG Ankle Complete Left Result Date: 11/28/2023 CLINICAL DATA:  Fall, deep laceration to medial ankle. EXAM: LEFT ANKLE COMPLETE - 3+ VIEW COMPARISON:  None Available. FINDINGS: There are acute mild-to-moderately displaced fractures of the medial and lateral malleoli with associated disruption of the ankle mortise. There is also probable avulsed fracture fragment adjacent to the calcaneocuboid joint seen on the lateral film which may represent additional fracture. No other acute fracture or dislocation. No aggressive  osseous lesion. Calcaneal spur noted along the Achilles tendon attachment site. No focal soft tissue swelling. No radiopaque foreign bodies. IMPRESSION: *Acute fractures of the medial and lateral malleoli with associated disruption of the ankle mortise. Probable avulsed fracture fragment adjacent to the calcaneocuboid joint. Electronically Signed   By: Ree Molt M.D.   On: 11/28/2023 08:21    Review of Systems  HENT:  Negative for ear discharge, ear pain, hearing loss and tinnitus.   Eyes:  Negative for photophobia and pain.  Respiratory:  Negative for cough and shortness of breath.   Cardiovascular:  Negative for chest pain.  Gastrointestinal:  Negative for abdominal pain, nausea and vomiting.  Genitourinary:  Negative for dysuria, flank pain, frequency and urgency.  Musculoskeletal:  Positive for arthralgias (Left ankle). Negative for back pain, myalgias and neck pain.  Neurological:  Negative for dizziness and headaches.  Hematological:  Does not bruise/bleed easily.  Psychiatric/Behavioral:  The patient is not nervous/anxious.    Blood pressure (!) 128/59, pulse 67, temperature 97.6 F (36.4 C), temperature source Oral, resp. rate 15, height 5' 2.5 (1.588 m), weight 72.6 kg, SpO2 100%. Physical Exam Constitutional:      General: She is not in acute distress.    Appearance: She is well-developed. She is not diaphoretic.  HENT:     Head: Normocephalic and atraumatic.  Eyes:     General: No scleral icterus.       Right eye: No discharge.        Left eye: No discharge.     Conjunctiva/sclera: Conjunctivae normal.  Cardiovascular:     Rate and Rhythm: Normal rate and regular rhythm.  Pulmonary:     Effort: Pulmonary effort is normal. No respiratory distress.  Musculoskeletal:     Cervical back: Normal range of motion.     Comments: LLE No traumatic wounds, ecchymosis, or rash  Mod TTP ankle, transverse laceration medial aspect  No knee effusion  Knee stable to varus/ valgus  and anterior/posterior stress  Sens DPN, SPN, TN intact  Motor EHL 5/5  DP 2+, No significant edema  Skin:    General: Skin is warm and dry.  Neurological:     Mental Status: She is alert.  Psychiatric:        Mood and Affect: Mood normal.        Behavior: Behavior normal.     Assessment/Plan: Open left ankle fx -- Plan I&D, ex fix vs ORIF today with Dr. Kendal. Please keep NPO.    Ozell DOROTHA Ned, PA-C Orthopedic Surgery [phone] 11/28/2023, 10:33 AM

## 2023-11-28 NOTE — Interval H&P Note (Signed)
 History and Physical Interval Note:  11/28/2023 12:45 PM  Gina Arnold  has presented today for surgery, with the diagnosis of Left Ankle Fracture.  The various methods of treatment have been discussed with the patient and family. After consideration of risks, benefits and other options for treatment, the patient has consented to  Procedure(s) with comments: OPEN REDUCTION INTERNAL FIXATION (ORIF) ANKLE FRACTURE (Left) - ORIF vs. EX FIX as a surgical intervention.  The patient's history has been reviewed, patient examined, no change in status, stable for surgery.  I have reviewed the patient's chart and labs.  Questions were answered to the patient's satisfaction.     Frantz Quattrone P Korver Graybeal

## 2023-11-28 NOTE — ED Notes (Signed)
Patient placed onto cardiac monitor

## 2023-11-28 NOTE — Anesthesia Preprocedure Evaluation (Addendum)
 Anesthesia Evaluation  Patient identified by MRN, date of birth, ID band Patient awake    Reviewed: Allergy & Precautions, NPO status , Patient's Chart, lab work & pertinent test results  History of Anesthesia Complications Negative for: history of anesthetic complications  Airway Mallampati: II  TM Distance: >3 FB Neck ROM: Full    Dental  (+) Dental Advisory Given, Teeth Intact   Pulmonary former smoker   Pulmonary exam normal        Cardiovascular hypertension, Pt. on medications Normal cardiovascular exam     Neuro/Psych negative neurological ROS  negative psych ROS   GI/Hepatic Neg liver ROS, PUD,GERD  Medicated and Controlled,, Fecal incontinence    Endo/Other  negative endocrine ROS    Renal/GU negative Renal ROS  Female GU complaint     Musculoskeletal negative musculoskeletal ROS (+)    Abdominal   Peds  Hematology  (+) Blood dyscrasia, anemia   Anesthesia Other Findings   Reproductive/Obstetrics                              Anesthesia Physical Anesthesia Plan  ASA: 2  Anesthesia Plan: General   Post-op Pain Management: Tylenol  PO (pre-op)* and Regional block*   Induction: Intravenous  PONV Risk Score and Plan: 3 and Treatment may vary due to age or medical condition, Ondansetron  and Propofol  infusion  Airway Management Planned: Oral ETT  Additional Equipment: None  Intra-op Plan:   Post-operative Plan: Extubation in OR  Informed Consent: I have reviewed the patients History and Physical, chart, labs and discussed the procedure including the risks, benefits and alternatives for the proposed anesthesia with the patient or authorized representative who has indicated his/her understanding and acceptance.     Dental advisory given  Plan Discussed with: CRNA and Anesthesiologist  Anesthesia Plan Comments:          Anesthesia Quick Evaluation

## 2023-11-28 NOTE — ED Provider Notes (Signed)
 I provided a substantive portion of the care of this patient.  I personally made/approved the management plan for this patient and take responsibility for the patient management.     Patient mechanical fall down her stairs at home.  Patient reports only injury is to her left ankle.  Examination shows an open ankle fracture. Personally reviewed the patient's x-rays and there is a displaced trimalleolar fracture.  Assisted in reducing and splinting of the fracture.  Sedation performed by myself. .Sedation  Date/Time: 11/28/2023 4:57 PM  Performed by: Armenta Canning, MD Authorized by: Armenta Canning, MD   Consent:    Consent obtained:  Verbal   Consent given by:  Patient   Risks discussed:  Allergic reaction, dysrhythmia, inadequate sedation, nausea, respiratory compromise necessitating ventilatory assistance and intubation, prolonged sedation necessitating reversal, prolonged hypoxia resulting in organ damage and vomiting Universal protocol:    Immediately prior to procedure, a time out was called: yes   Indications:    Procedure performed:  Fracture reduction   Procedure necessitating sedation performed by:  Different physician Pre-sedation assessment:    Time since last food or drink:  12   NPO status caution: urgency dictates proceeding with non-ideal NPO status     ASA classification: class 2 - patient with mild systemic disease     Mouth opening:  2 finger widths   Mallampati score:  II - soft palate, uvula, fauces visible   Neck mobility: normal     Pre-sedation assessments completed and reviewed: airway patency, cardiovascular function, hydration status, mental status, nausea/vomiting, pain level and respiratory function   A pre-sedation assessment was completed prior to the start of the procedure Procedure details (see MAR for exact dosages):    Preoxygenation:  Nasal cannula   Sedation:  Propofol    Intended level of sedation: deep   Intra-procedure monitoring:  Blood  pressure monitoring, cardiac monitor, continuous pulse oximetry, continuous capnometry, frequent LOC assessments and frequent vital sign checks   Intra-procedure events: none     Total Provider sedation time (minutes):  20 Post-procedure details:   A post-sedation assessment was completed following the completion of the procedure.     Armenta Canning, MD 11/28/23 [phone]

## 2023-11-28 NOTE — Sedation Documentation (Signed)
 Another 10mg  Propofol  given

## 2023-11-29 ENCOUNTER — Other Ambulatory Visit (HOSPITAL_COMMUNITY): Payer: Self-pay

## 2023-11-29 LAB — CBC
HCT: 34.4 % — ABNORMAL LOW (ref 36.0–46.0)
Hemoglobin: 11.8 g/dL — ABNORMAL LOW (ref 12.0–15.0)
MCH: 29.4 pg (ref 26.0–34.0)
MCHC: 34.3 g/dL (ref 30.0–36.0)
MCV: 85.6 fL (ref 80.0–100.0)
Platelets: 193 K/uL (ref 150–400)
RBC: 4.02 MIL/uL (ref 3.87–5.11)
RDW: 12.5 % (ref 11.5–15.5)
WBC: 6.9 K/uL (ref 4.0–10.5)
nRBC: 0 % (ref 0.0–0.2)

## 2023-11-29 LAB — BASIC METABOLIC PANEL WITH GFR
Anion gap: 7 (ref 5–15)
BUN: 13 mg/dL (ref 8–23)
CO2: 22 mmol/L (ref 22–32)
Calcium: 8.5 mg/dL — ABNORMAL LOW (ref 8.9–10.3)
Chloride: 110 mmol/L (ref 98–111)
Creatinine, Ser: 0.8 mg/dL (ref 0.44–1.00)
GFR, Estimated: >60 mL/min (ref >60)
Glucose, Bld: 128 mg/dL — ABNORMAL HIGH (ref 70–99)
Potassium: 4 mmol/L (ref 3.5–5.1)
Sodium: 139 mmol/L (ref 135–145)

## 2023-11-29 MED ORDER — BISACODYL 5 MG PO TBEC
5.0000 mg | DELAYED_RELEASE_TABLET | Freq: Every day | ORAL | 0 refills | Status: AC | PRN
  Filled 2023-11-29: qty 30, 30d supply, fill #0

## 2023-11-29 MED ORDER — METHOCARBAMOL 500 MG PO TABS
500.0000 mg | ORAL_TABLET | Freq: Four times a day (QID) | ORAL | 0 refills | Status: AC | PRN
  Filled 2023-11-29: qty 20, 5d supply, fill #0

## 2023-11-29 MED ORDER — OXYCODONE HCL 5 MG PO TABS
2.5000 mg | ORAL_TABLET | ORAL | 0 refills | Status: AC | PRN
  Filled 2023-11-29: qty 15, 5d supply, fill #0

## 2023-11-29 MED ORDER — ASPIRIN 325 MG PO TBEC
325.0000 mg | DELAYED_RELEASE_TABLET | Freq: Every day | ORAL | 0 refills | Status: AC
  Filled 2023-11-29: qty 30, 30d supply, fill #0

## 2023-11-29 MED ORDER — DOCUSATE SODIUM 100 MG PO CAPS
100.0000 mg | ORAL_CAPSULE | Freq: Two times a day (BID) | ORAL | 0 refills | Status: AC
  Filled 2023-11-29: qty 10, 5d supply, fill #0

## 2023-11-29 MED ORDER — HYDROMORPHONE HCL 1 MG/ML IJ SOLN: 0.5000 mg | INTRAMUSCULAR | Status: DC | PRN

## 2023-11-29 NOTE — TOC CAGE-AID Note (Signed)
 Transition of Care [facility]) - CAGE-AID Screening   Patient Details  Name: Gina Arnold MRN: [id number] Date of Birth: 1945/08/09  [facility]) CM/SW Contact:    Zekiel Torian E Margit Batte, LCSW Phone Number: 11/29/2023, 9:34 AM   Clinical Narrative: No SA noted.   CAGE-AID Screening:    Have You Ever Felt You Ought to Cut Down on Your Drinking or Drug Use?: No Have People Annoyed You By Critizing Your Drinking Or Drug Use?: No Have You Felt Bad Or Guilty About Your Drinking Or Drug Use?: No Have You Ever Had a Drink or Used Drugs First Thing In The Morning to Steady Your Nerves or to Get Rid of a Hangover?: No CAGE-AID Score: 0  Substance Abuse Education Offered: No

## 2023-11-29 NOTE — Progress Notes (Signed)
    Durable Medical Equipment  (From admission, onward)           Start     Ordered   11/29/23 1514  For home use only DME Bedside commode  Once       Comments: Patient confined to one room.  Question:  Patient needs a bedside commode to treat with the following condition  Answer:  Ankle fracture, left   11/29/23 1514   11/29/23 1401  For home use only DME Other see comment  Once       Comments: Knee Scooter  Question:  Length of Need  Answer:  6 Months   11/29/23 1400   11/29/23 1359  For home use only DME Walker rolling  Once       Question Answer Comment  Walker: With 5 Inch Wheels   Patient needs a walker to treat with the following condition Ankle fracture, left      11/29/23 1400

## 2023-11-29 NOTE — Progress Notes (Signed)
 Orthopaedic Trauma Progress Note  SUBJECTIVE: Doing fairly well this morning.  Just finished her breakfast.  Pain controlled at rest.  Has not been up out of bed yet since surgery.  Denies any numbness or tingling throughout the left lower extremity.  Describes her ankle as feeling raw.  Denies chest pain. No SOB. No nausea/vomiting. No other complaints.  Tolerating diet and fluids.  No BM since admission.  Does normally have BM on daily basis.  Denies any abdominal pain. Patient lives at home with her husband and who will be able to assist her at discharge.  Does have about 2-3 steps to get inside the house.  OBJECTIVE:  Vitals:   11/29/23 0613 11/29/23 0729  BP: (!) 152/78 (!) 122/59  Pulse: 93 80  Resp: 18 17  Temp:  97.6 F (36.4 C)  SpO2: 100% 98%    Opiates Today (MME): Today's  total administered Morphine Milligram Equivalents: 0 Opiates Yesterday (MME): Yesterday's total administered Morphine Milligram Equivalents: 112.5  General: Sitting up in bed, no acute distress.  Pleasant and cooperative. Respiratory: No increased work of breathing.  Operative Extremity (left lower extremity): Well-padded, well-fitting splint in place.  Nontender above splint.  Endorses sensation to light touch over the toes as well as the dorsal and plantar aspect of the forefoot.  The remainder of motor and sensory exam to the foot and ankle limited secondary to splint placement.  Toes are warm and well-perfused.  IMAGING: Stable post op imaging left ankle  LABS:  Results for orders placed or performed during the hospital encounter of 11/28/23 (from the past 24 hours)  Surgical pcr screen     Status: Abnormal   Collection Time: 11/28/23 12:12 PM   Specimen: Nasal Mucosa; Nasal Swab  Result Value Ref Range   MRSA, PCR NEGATIVE NEGATIVE   Staphylococcus aureus POSITIVE (A) NEGATIVE  VITAMIN D  25 Hydroxy (Vit-D Deficiency, Fractures)     Status: None   Collection Time: 11/28/23  6:28 PM  Result Value  Ref Range   Vit D, 25-Hydroxy 67.25 30 - 100 ng/mL  Basic metabolic panel     Status: Abnormal   Collection Time: 11/29/23  5:14 AM  Result Value Ref Range   Sodium 139 135 - 145 mmol/L   Potassium 4.0 3.5 - 5.1 mmol/L   Chloride 110 98 - 111 mmol/L   CO2 22 22 - 32 mmol/L   Glucose, Bld 128 (H) 70 - 99 mg/dL   BUN 13 8 - 23 mg/dL   Creatinine, Ser 9.19 0.44 - 1.00 mg/dL   Calcium 8.5 (L) 8.9 - 10.3 mg/dL   GFR, Estimated >39 >39 mL/min   Anion gap 7 5 - 15  CBC     Status: Abnormal   Collection Time: 11/29/23  5:14 AM  Result Value Ref Range   WBC 6.9 4.0 - 10.5 K/uL   RBC 4.02 3.87 - 5.11 MIL/uL   Hemoglobin 11.8 (L) 12.0 - 15.0 g/dL   HCT 65.5 (L) 63.9 - 53.9 %   MCV 85.6 80.0 - 100.0 fL   MCH 29.4 26.0 - 34.0 pg   MCHC 34.3 30.0 - 36.0 g/dL   RDW 87.4 88.4 - 84.4 %   Platelets 193 150 - 400 K/uL   nRBC 0.0 0.0 - 0.2 %    ASSESSMENT: Gina Arnold is a 78 y.o. female, 1 Day Post-Op s/p fall downstairs Procedures: ORIF LEFT OPEN ANKLE FRACTURE  CV/Blood loss: Hemoglobin 11.8 this morning, stable from admission.  Hemodynamically stable  PLAN: Weightbearing: NWB LLE ROM: Okay for knee motion.  Maintain splint Incisional and dressing care: Dressings left intact until follow-up  Showering: Keep splint covered and dry Orthopedic device(s): Splint LLE Pain management:  1. Tylenol  1000 mg q 6 hours scheduled 2. Robaxin  500 mg q 6 hours PRN 3. Oxycodone  2.5-5 mg q 4 hours PRN 4. Dilaudid  0.5 mg q 4 hours PRN VTE prophylaxis: Lovenox , SCDs ID:  Ancef  2gm post op per open fracture protocol Foley/Lines:  No foley, KVO IVFs Impediments to Fracture Healing: Open fracture.  Vitamin D  level 67, no additional supplementation needed Dispo: PT/OT evaluation today, dispo pending.  Patient would prefer discharge home.  Patient okay to utilize knee scooter when working with therapies  D/C recommendations: - Oxycodone  2.5 mg, Tylenol , Robaxin  for pain control - Aspirin  325 mg  daily x 30 days for DVT prophylaxis - No additional need for Vit D supplementation  Follow - up plan: 2 weeks after d/c for wound check and repeat x-rays   Contact information:  Franky Light MD, Lauraine Moores PA-C. After hours and holidays please check [email or web address] for group call information for [facility]   Lauraine PATRIC Moores, PA-C [phone] (office) [email or web address]

## 2023-11-29 NOTE — Evaluation (Signed)
 Physical Therapy Evaluation  Patient Details Name: Gina Arnold MRN: [id number] DOB: [birth year] Today's Date: 11/29/2023  History of Present Illness  Pt is a 78 y/o female who presents 11/28/2023 s/p fall sustaining a L bimalleolar ankle fx. She is now s/p ORIF and is NWB on the LLE. PMH significant for HTN, stress incontinence.  Clinical Impression  Pt admitted with above diagnosis. Pt currently with functional limitations due to the deficits listed below (see PT Problem List). At the time of PT eval pt was able to perform transfers and minimal ambulation with gross CGA and RW for support. For longer distance mobility, pt used the knee scooter. She was able to negotiate the knee scooter well with no overt LOB. Hands on guarding throughout training with knee scooter provided for safety. Pt reports she has access to DME from her church, and is looking at what would be available to her prior to purchasing DME. Recommend knee scooter first, however if pt cannot get it and does not want to pay out of pocket for it, then recommend a wheelchair. Pt will benefit from acute skilled PT to increase their independence and safety with mobility to allow discharge.           If plan is discharge home, recommend the following: A little help with walking and/or transfers;A little help with bathing/dressing/bathroom;Assistance with cooking/housework;Assist for transportation;Help with stairs or ramp for entrance   Can travel by private vehicle        Equipment Recommendations Rolling walker (2 wheels);Wheelchair (measurements PT);Wheelchair cushion (measurements PT);BSC/3in1;Other (comment) (Knee scooter)  Recommendations for Other Services       Functional Status Assessment Patient has had a recent decline in their functional status and demonstrates the ability to make significant improvements in function in a reasonable and predictable amount of time.     Precautions / Restrictions  Precautions Precautions: Fall Recall of Precautions/Restrictions: Intact Restrictions Weight Bearing Restrictions Per Provider Order: Yes LLE Weight Bearing Per Provider Order: Non weight bearing      Mobility  Bed Mobility Overal bed mobility: Modified Independent                  Transfers Overall transfer level: Needs assistance Equipment used: Rolling walker (2 wheels) (Knee scooter) Transfers: Sit to/from Stand, Bed to chair/wheelchair/BSC Sit to Stand: Supervision   Step pivot transfers: Supervision       General transfer comment: Pt able to stand to the RW well and take a few hopping steps from chair to bed. Pt then stood to the knee scooter, and was able to pivot around to place L knee on scooter well without LOB.    Ambulation/Gait Ambulation/Gait assistance: Contact guard assist Gait Distance (Feet): 100 Feet (x2) Assistive device: Knee scooter Gait Pattern/deviations: Step-to pattern, Decreased stride length, Trunk flexed Gait velocity: Decreased Gait velocity interpretation: 1.31 - 2.62 ft/sec, indicative of limited community ambulator   General Gait Details: Pt took a few steps in the room with RW, however was able to mobilize in the hall with knee scooter well without assist. Hands on guarding provided for safety. Pt quickly achieved a smooth glide on knee scooter. Recommend pt have shoes on for future knee scooter training.  Stairs         General stair comments: Discussed several options for negotiating stairs at home, including backwards with RW, bumping up the stairs with wheelchair, and backing up to the stairs to sit on chair placed at top. Pt reports she would  like to either back up to sit, or be bumped up in the wheelchair.  Wheelchair Mobility     Tilt Bed    Modified Rankin (Stroke Patients Only)       Balance                                             Pertinent Vitals/Pain Pain Assessment Pain Assessment:  Faces Faces Pain Scale: Hurts a little bit Pain Location: L ankle Pain Descriptors / Indicators:  (rubbing) Pain Intervention(s): Limited activity within patient's tolerance, Monitored during session, Repositioned    Home Living Family/patient expects to be discharged to:: Private residence Living Arrangements: Spouse/significant other Available Help at Discharge: Family;Available 24 hours/day Type of Home: House Home Access: Stairs to enter Entrance Stairs-Rails: None Entrance Stairs-Number of Steps: 2 to the porch and then 2 to get inside the house Alternate Level Stairs-Number of Steps: 14 Home Layout: Two level;Able to live on main level with bedroom/bathroom Home Equipment: Hand held shower head      Prior Function Prior Level of Function : Independent/Modified Independent;Driving;History of Falls (last six months)             Mobility Comments: [place] in June while swatting a mosquito ADLs Comments: Independent     Extremity/Trunk Assessment   Upper Extremity Assessment Upper Extremity Assessment: Defer to OT evaluation    Lower Extremity Assessment Lower Extremity Assessment: LLE deficits/detail LLE Deficits / Details: Acute pain, decreased strength and AROM consistent with above mentioned surgery. LLE: Unable to fully assess due to immobilization    Cervical / Trunk Assessment Cervical / Trunk Assessment: Normal  Communication   Communication Communication: No apparent difficulties    Cognition Arousal: Alert Behavior During Therapy: WFL for tasks assessed/performed   PT - Cognitive impairments: No apparent impairments                         Following commands: Intact       Cueing Cueing Techniques: Verbal cues, Gestural cues     General Comments      Exercises     Assessment/Plan    PT Assessment Patient needs continued PT services  PT Problem List Decreased strength;Decreased activity tolerance;Decreased balance;Decreased  mobility;Decreased knowledge of use of DME;Decreased safety awareness;Decreased knowledge of precautions;Pain       PT Treatment Interventions DME instruction;Gait training;Stair training;Functional mobility training;Therapeutic activities;Therapeutic exercise;Balance training;Patient/family education    PT Goals (Current goals can be found in the Care Plan section)  Acute Rehab PT Goals Patient Stated Goal: Home at d/c PT Goal Formulation: With patient/family Time For Goal Achievement: 12/06/23 Potential to Achieve Goals: Good    Frequency Min 2X/week     Co-evaluation PT/OT/SLP Co-Evaluation/Treatment: Yes Reason for Co-Treatment: Complexity of the patient's impairments (multi-system involvement);For patient/therapist safety;To address functional/ADL transfers PT goals addressed during session: Mobility/safety with mobility;Balance;Proper use of DME;Strengthening/ROM         AM-PAC PT 6 Clicks Mobility  Outcome Measure Help needed turning from your back to your side while in a flat bed without using bedrails?: None Help needed moving from lying on your back to sitting on the side of a flat bed without using bedrails?: None Help needed moving to and from a bed to a chair (including a wheelchair)?: A Little Help needed standing up from a chair using your arms (  e.g., wheelchair or bedside chair)?: A Little Help needed to walk in hospital room?: A Little Help needed climbing 3-5 steps with a railing? : A Lot 6 Click Score: 19    End of Session Equipment Utilized During Treatment: Gait belt Activity Tolerance: Patient tolerated treatment well Patient left: in chair;with call bell/phone within reach;with family/visitor present Nurse Communication: Mobility status PT Visit Diagnosis: Unsteadiness on feet (R26.81);History of falling (Z91.81);Difficulty in walking, not elsewhere classified (R26.2)    Time: 1136-1206 PT Time Calculation (min) (ACUTE ONLY): 30 min   Charges:    PT Evaluation $PT Eval Moderate Complexity: 1 Mod   PT General Charges $$ ACUTE PT VISIT: 1 Visit         Leita Sable, PT, DPT Acute Rehabilitation Services Secure Chat Preferred Office: [phone]   Leita JONETTA Sable 11/29/2023, 2:18 PM

## 2023-11-29 NOTE — Progress Notes (Signed)
 Pt being discharged, VSS, Education complete by SWOT, Pending medical equip delivery  Laneta JAYSON Rao, RN 11/29/2023 3:16 PM

## 2023-11-29 NOTE — Discharge Instructions (Addendum)
 Franky Light, MD Lauraine Moores PA-C [facility] [address] [phone] (tel)   [phone] (fax)                                  POST-OPERATIVE INSTRUCTIONS   WEIGHT BEARING STATUS: Non-weightbearing left lower extremity   RANGE OF MOTION/ACTIVITY:  Ok for knee motion as tolerated. Keep splint in place   WOUND CARE Please keep splint clean dry and intact until follow-up. If your splint gets wet for any reason please contact the office immediately.  Do not stick anything down your splint such as pencils, momey, hangers to try and scratch yourself.  If you feel itchy take Benadryl  as prescribed on the bottle for itching You may shower on Post-Op Day #2.  You must keep splint dry during this process and may find that a plastic bag taped around the extremity or alternatively a towel based bath may be a better option.   If you get your splint wet or if it is damaged please contact our clinic.  EXERCISES Due to your splint being in place you will not be able to bear weight through your extremity.   DO NOT PUT ANY WEIGHT ON YOUR OPERATIVE LEG Please use crutches or a walker to avoid weight bearing.   DVT/PE prophylaxis: Aspirin   DIET: As you were eating previously.  Can use over the counter stool softeners and bowel preparations, such as Miralax , to help with bowel movements.  Narcotics can be constipating.  Be sure to drink plenty of fluids  REGIONAL ANESTHESIA (NERVE BLOCKS) The anesthesia team may have performed a nerve block for you if safe in the setting of your care.  This is a great tool used to minimize pain.  Typically the block may start wearing off overnight but the long acting medicine may last for 3-4 days.  The nerve block wearing off can be a challenging period but please utilize your as needed pain medications to try and manage this period.    POST-OP MEDICATIONS- Multimodal approach to pain control  In general your pain will be controlled  with a combination of substances.  Prescriptions unless otherwise discussed are electronically sent to your pharmacy.  This is a carefully made plan we use to minimize narcotic use.     - Meloxicam OR Celebrex - Anti-inflammatory medication taken on a scheduled basis  - Acetaminophen  - Non-narcotic pain medicine taken on a scheduled basis   - Oxycodone  - This is a strong narcotic, to be used only on an as needed basis for pain.  -  Aspirin  325mg  - This medicine is used to minimize the risk of blood clots after surgery.             -          Zofran  - take as needed for nausea   FOLLOW-UP If you develop a Fever (>101.5), Redness or Drainage from the surgical incision site, please call our office to arrange for an evaluation. Please call the office to schedule a follow-up appointment for your incision check if you do not already have one, 7-10 days post-operatively.   VISIT OUR WEBSITE FOR ADDITIONAL INFORMATION: [email or web address]   HELPFUL INFORMATION You should wean off your narcotic medicines as soon as you are able.  Most patients will be off or using minimal narcotics before their first postop appointment.   We suggest you use the pain medication  the first night prior to going to bed, in order to ease any pain when the anesthesia wears off. You should avoid taking pain medications on an empty stomach as it will make you nauseous.  Do not drink alcoholic beverages or take illicit drugs when taking pain medications.  In most states it is against the law to drive while you are in a splint or sling.  And certainly against the law to drive while taking narcotics.  You may return to work/school in the next couple of days when you feel up to it.   Pain medication may make you constipated.  Below are a few solutions to try in this order: Decrease the amount of pain medication if you aren't having pain. Drink lots of decaffeinated fluids. Drink prune juice and/or each dried prunes  If the  first 3 don't work start with additional solutions Take Colace - an over-the-counter stool softener Take Senokot - an over-the-counter laxative Take Miralax  - a stronger over-the-counter laxative  -----------------------------------------------------  To Gina Arnold or their caretakers,  You were recently admitted to [facility] for an open left ankle fracture.  Your condition was treated with surgery.  You received antibiotics to prevent infection during your stay.  We have prescribed you muscle relaxers and pain medications that you can take while in the acute healing process.  We have also ordered the necessary durable medical equipment that you will need at home.  Please make sure to not bear weight on your left leg for 2 weeks until you see your orthopedic surgeon for a follow-up visit.  Continue taking your home medications with the following changes:  Start taking Aspirin  EC 325 mg daily Bisacodyl  (Dulcolax) 5 mg EC tablet daily as needed for moderate constipation Docusate sodium  (Colace) 100 mg capsule - take 1 capsule twice daily for constipation Methocarbamol  (Robaxin ) 500 mg every 6 hours as needed for muscle spasms Oxycodone  (Oxy IR/Roxicodone ) 5 mg immediate release tablet - take 0.5 every 4 hours as needed for pain Stop taking  Continue taking Acetaminophen  (Tylenol ) 500 mg and take 2 tablets as needed for moderate pain Amlodipine  (Norvasc ) 5 mg daily Vitamin B capsule Calcium p.o. D-mannose p.o. Famotidine (Pepcid) 40 mg at bedtime Magnesium  p.o. at bedtime Melatonin p.o. HyperTET Omeprazole  (Prilosec) 40 mg in the morning and at bedtime Oxybutynin (Ditropan) 5 mg in the morning and at bedtime Phentermine (Adipex-P) 37.5 mg daily Simvastatin  (Zocor ) 20 mg at bedtime Topiramate (Topamax) 50 mg at bedtime Cyanocobalamin (vitamin B-12 p.o.) daily Sorbic acid (vitamin C p.o.) and morning and at bedtime  You should seek further medical care if you  experience worsening pain, swelling, or bleeding of your surgical site.  Also seek medical care if you experience unilateral leg swelling or shortness of breath.  Please follow up with the following doctors/specialties: - Orthopedic surgery in 2 weeks for postop visit  We recommend that you also see your primary care doctor in about a week to make sure that you continue to improve. We are so glad that you are feeling better.  Sincerely,  [facility]

## 2023-11-29 NOTE — Evaluation (Signed)
 Occupational Therapy Evaluation Patient Details Name: Gina Arnold MRN: [id number] DOB: [birth year] Today's Date: 11/29/2023   History of Present Illness   Pt is a 78 y/o female who presents 11/28/2023 s/p fall sustaining a L bimalleolar ankle fx. She is now s/p ORIF and is NWB on the LLE. PMH significant for HTN, stress incontinence.     Clinical Impressions PTA, pt lived with spouse and was independent in ADL and IADL. Upon eval, pt needing up to [facility] for BADL and functional mobility. Pt with decreased knowledge of compensatory techniques for maintaining NWB precautions to LLE. Pt to continue to benefit from acute OT services, but do not anticipate need for follow up OT after discharge.      If plan is discharge home, recommend the following:   A little help with walking and/or transfers;A little help with bathing/dressing/bathroom;Assistance with cooking/housework;Assist for transportation;Help with stairs or ramp for entrance     Functional Status Assessment   Patient has had a recent decline in their functional status and demonstrates the ability to make significant improvements in function in a reasonable and predictable amount of time.     Equipment Recommendations   None recommended by OT (per pt, she can get from her church)     Recommendations for Other Services         Precautions/Restrictions   Precautions Precautions: Fall Recall of Precautions/Restrictions: Intact Restrictions Weight Bearing Restrictions Per Provider Order: Yes LLE Weight Bearing Per Provider Order: Non weight bearing     Mobility Bed Mobility Overal bed mobility: Modified Independent                  Transfers Overall transfer level: Needs assistance Equipment used: Rolling walker (2 wheels) (Knee scooter) Transfers: Sit to/from Stand, Bed to chair/wheelchair/BSC Sit to Stand: Supervision     Step pivot transfers: Supervision     General transfer comment: Pt able  to stand to the RW well and take a few hopping steps from chair to bed. Pt then stood to the knee scooter, and was able to pivot around to place L knee on scooter well without LOB.      Balance                                           ADL either performed or assessed with clinical judgement   ADL Overall ADL's : Needs assistance/impaired Eating/Feeding: Independent   Grooming: Set up;Sitting Grooming Details (indicate cue type and reason): reviewed compensatory technqiues Upper Body Bathing: Set up;Sitting   Lower Body Bathing: Contact guard assist;Sit to/from stand   Upper Body Dressing : Set up;Sitting   Lower Body Dressing: Contact guard assist;Sit to/from stand   Toilet Transfer: Contact guard assist;Ambulation;Rolling walker (2 wheels)   Toileting- Clothing Manipulation and Hygiene: Sit to/from stand;Contact guard assist       Functional mobility during ADLs: Contact guard assist;Rolling walker (2 wheels) (vs knee scooter)       Vision Patient Visual Report: No change from baseline       Perception Perception: Not tested       Praxis         Pertinent Vitals/Pain Pain Assessment Pain Assessment: Faces Faces Pain Scale: Hurts a little bit Pain Location: L ankle Pain Descriptors / Indicators:  (rubbing) Pain Intervention(s): Limited activity within patient's tolerance, Monitored during session     Extremity/Trunk  Assessment Upper Extremity Assessment Upper Extremity Assessment: Overall WFL for tasks assessed   Lower Extremity Assessment Lower Extremity Assessment: Defer to PT evaluation LLE Deficits / Details: Acute pain, decreased strength and AROM consistent with above mentioned surgery. LLE: Unable to fully assess due to immobilization   Cervical / Trunk Assessment Cervical / Trunk Assessment: Normal   Communication Communication Communication: No apparent difficulties   Cognition Arousal: Alert Behavior During Therapy:  WFL for tasks assessed/performed Cognition: No apparent impairments                               Following commands: Intact       Cueing  General Comments   Cueing Techniques: Verbal cues;Gestural cues      Exercises     Shoulder Instructions      Home Living Family/patient expects to be discharged to:: Private residence Living Arrangements: Spouse/significant other Available Help at Discharge: Family;Available 24 hours/day Type of Home: House Home Access: Stairs to enter [employer] of Steps: 2 to the porch and then 2 to get inside the house Entrance Stairs-Rails: None Home Layout: Two level;Able to live on main level with bedroom/bathroom Alternate Level Stairs-Number of Steps: 14   Bathroom Shower/Tub: Tub/shower unit;Door (sliding door)   Teacher, 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, 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)   Home Equipment: Hand held shower head          Prior Functioning/Environment Prior Level of Function : Independent/Modified Independent;Driving;History of Falls (last six months)             Mobility Comments: Fell in June while swatting a mosquito ADLs Comments: Independent    OT Problem List: Decreased strength;Decreased activity tolerance;Impaired balance (sitting and/or standing);Decreased knowledge of use of DME or AE;Decreased knowledge of precautions;Pain   OT Treatment/Interventions: Self-care/ADL training;Therapeutic exercise;DME and/or AE instruction;Therapeutic activities;Patient/family education;Balance training      OT Goals(Current goals can be found in the care plan section)   Acute Rehab OT Goals Patient Stated Goal: walk OT Goal Formulation: With patient Time For Goal Achievement: 12/13/23 Potential to Achieve Goals: Good   OT Frequency:  Min 2X/week    Co-evaluation PT/OT/SLP Co-Evaluation/Treatment: Yes Reason for Co-Treatment: Complexity of the patient's impairments (multi-system  involvement);For patient/therapist safety;To address functional/ADL transfers PT goals addressed during session: Mobility/safety with mobility;Balance;Proper use of DME;Strengthening/ROM OT goals addressed during session: ADL's and self-care;Proper use of Adaptive equipment and DME      AM-PAC OT 6 Clicks Daily Activity     Outcome Measure Help from another person eating meals?: None Help from another person taking care of personal grooming?: A Little Help from another person toileting, which includes using toliet, bedpan, or urinal?: A Little Help from another person bathing (including washing, rinsing, drying)?: A Little Help from another person to put on and taking off regular upper body clothing?: A Little Help from another person to put on and taking off regular lower body clothing?: A Little 6 Click Score: 19   End of Session Equipment Utilized During Treatment: Gait belt;Rolling walker (2 wheels);Other (comment) (knee scooter) Nurse Communication: Mobility status  Activity Tolerance: Patient tolerated treatment well Patient left: in chair;with call bell/phone within reach;with family/visitor present;with chair alarm set  OT Visit Diagnosis: Unsteadiness on feet (R26.81);Muscle weakness (generalized) (M62.81);Pain Pain - Right/Left: Left Pain - part of body: Ankle and joints of foot  Time: 8863-8797 OT Time Calculation (min): 26 min Charges:  OT General Charges $OT Visit: 1 Visit OT Evaluation $OT Eval Low Complexity: 1 Low  Elma JONETTA Lebron FREDERICK, OTR/L [facility] Acute Rehabilitation Office: [phone]   Elma JONETTA Lebron 11/29/2023, 5:56 PM

## 2023-11-29 NOTE — Progress Notes (Addendum)
 Pt equipment did not arrive, I called 6 case managers and the [facility] to expedite the process, after 2.5 hours and frustration from the patient it was decided the patient would stay another night in order to be safe at home.    Please ensure the patient has equipment prior to leaving.   Ky Rumple C Warden, RN 11/29/2023 6:47 PM    TOC meds taken to Pharmacy for securement

## 2023-11-29 NOTE — Plan of Care (Signed)

## 2023-11-29 NOTE — Discharge Summary (Signed)
 Name: Gina Arnold MRN: [id number] DOB: Dec 22, 1945 78 y.o. PCP: Auston Reyes BIRCH, MD  Date of Admission: 11/28/2023  7:22 AM Date of Discharge: 11/29/2023  Attending Physician: Dr. Dayton Eastern  Discharge Diagnosis: Principal Problem:   Ankle fracture, left Active Problems:   Anemia   Gastroesophageal reflux disease with esophagitis   HTN (hypertension)   Hyperlipidemia Mechanical fall Stress incontinence Meningioma  Discharge Medications: Allergies as of 11/29/2023       Reactions   Azulfidine [sulfasalazine] Nausea And Vomiting, Other (See Comments)   Leukopenia    Cipro [ciprofloxacin Hcl] Nausea And Vomiting, Other (See Comments)   Fatigue Indigestion    Lortab [hydrocodone-acetaminophen ] Itching   Codeine Itching   Flagyl [metronidazole] Nausea And Vomiting, Other (See Comments)   Fatigue  Indigestion    Sulfa Antibiotics Other (See Comments)   Leukopenia    Ultram [tramadol] Nausea And Vomiting, Other (See Comments)   GI intolerance, pain        Medication List     TAKE these medications    acetaminophen  500 MG tablet Commonly known as: TYLENOL  Take 1,000 mg by mouth 2 (two) times daily as needed for moderate pain (pain score 4-6) or headache.   amLODipine  5 MG tablet Commonly known as: NORVASC  Take 1 tablet by mouth daily.   aspirin  EC 325 MG tablet Take 1 tablet (325 mg total) by mouth daily.   b complex vitamins capsule Take 1 capsule by mouth daily.   bisacodyl  5 MG EC tablet Commonly known as: DULCOLAX Take 1 tablet (5 mg total) by mouth daily as needed for moderate constipation.   CALCIUM PO Take 1 tablet by mouth at bedtime.   D-MANNOSE PO Take 1 tablet by mouth daily.   docusate sodium  100 MG capsule Commonly known as: COLACE Take 1 capsule (100 mg total) by mouth 2 (two) times daily.   famotidine 40 MG tablet Commonly known as: PEPCID Take 40 mg by mouth at bedtime.   MAGNESIUM  PO Take 1 tablet by mouth at bedtime.    MELATONIN PO Take 1 tablet by mouth at bedtime.   methocarbamol  500 MG tablet Commonly known as: ROBAXIN  Take 1 tablet (500 mg total) by mouth every 6 (six) hours as needed for muscle spasms.   omeprazole  40 MG capsule Commonly known as: PRILOSEC Take 40 mg by mouth in the morning and at bedtime.   oxybutynin 5 MG tablet Commonly known as: DITROPAN Take 5 mg by mouth in the morning and at bedtime.   oxyCODONE  5 MG immediate release tablet Commonly known as: Roxicodone  Take 0.5 tablets (2.5 mg total) by mouth every 4 (four) hours as needed for up to 5 days.   phentermine 37.5 MG tablet Commonly known as: ADIPEX-P Take 37.5 mg by mouth daily.   simvastatin  20 MG tablet Commonly known as: ZOCOR  Take 1 tablet by mouth at bedtime.   topiramate 50 MG tablet Commonly known as: TOPAMAX Take 50 mg by mouth at bedtime.   VITAMIN B-12 PO Take 1 tablet by mouth daily.   VITAMIN C PO Take 1 tablet by mouth in the morning and at bedtime.               Durable Medical Equipment  (From admission, onward)           Start     Ordered   11/29/23 1401  For home use only DME Other see comment  Once       Comments: Knee Scooter  Question:  Length of Need  Answer:  6 Months   11/29/23 1400   11/29/23 1400  For home use only DME Bedside commode  Once       Question:  Patient needs a bedside commode to treat with the following condition  Answer:  Ankle fracture, left   11/29/23 1400   11/29/23 1359  For home use only DME Walker rolling  Once       Question Answer Comment  Walker: With 5 Inch Wheels   Patient needs a walker to treat with the following condition Ankle fracture, left      11/29/23 1400            Disposition and follow-up:   Gina Arnold was discharged from [facility] in Stable condition.  At the hospital follow up visit please address:  1.  Follow-up:  a. Left Ankle Fracture - she had an open left ankle fracture that was  treated with antibiotics while she was in the hospital.  Please ensure that the surgery site is well-healing without bleeding or swelling.  She already has orthopedic surgery postop visit scheduled.    b.  Mechanical fall - patient has had several mechanical falls in the last year.  Subjectively she is concerned about falling.  Please consider discontinuing topiramate, phentermine, oxybutynin as this may have increased her fall risk.   c.  Anemia - hemoglobin was 11.8 during her hospitalization.  There were no acute concerns for bleed.  May be due to postoperative changes.  Please assess for bleeding/anemia signs and symptoms.   d. Meningioma -please ensure the patient has adequate neurosurgery follow-up for this meningioma.  Doubt that this played a part in her mechanical falls.  2.  Labs / imaging needed at time of follow-up: none  3.  Pending labs/ test needing follow-up: none   Follow-up Appointments:  Follow-up Information     Haddix, Franky SQUIBB, MD. Schedule an appointment as soon as possible for a visit in 2 week(s).   Specialty: Orthopedic Surgery Why: for wound check, splint/suture removal, and repeat x-rays Contact information: [address] [place] [address] [phone]                 Hospital Course by problem list: #Acute left ankle fracture # Fall [name] presented following a fall, with imaging revealing an acute fracture of both the medial and lateral malleoli, along with distraction of the ankle mortise.  She was treated with Ancef  x 3 due to fracture being open.  She required surgical fixation of her ankle fractures.  Her previous fall in June 2025 occurred while trying to swat a fly and slipping. Although both falls appear to be mechanical, I remain concerned about underlying factors such as balance and gait abnormalities, muscle weakness, medication effects, and physical deconditioning, especially given her history of vitamin D  deficiency.  Environmental factors, including poor lighting or cluttered floors at home, may also contribute. The absence of dizziness, confusion, weakness, or systemic signs of infection is reassuring, making cardiovascular, neurological, or infectious causes less likely. She reported taking  phentermine, which can cause cardiovascular side effects such as palpitations, although she has not reported any symptoms.  Additionally, her stress urinary incontinence may have contributed to her urgency and rushing to the bathroom, increasing the risk of slipping and falling.during this admission, she was given as needed pain control with IV Dilaudid  and p.o. oxycodone , however, the patient did not use either of these.  Instead she used Robaxin  to help with her pain.  She also received physical therapy and Occupational Therapy evaluation and indicated she would be okay to discharge home with appropriate DME.  Upon discharge orthopedics recommended continuing aspirin  for 1 month for DVT prophylaxis and scheduled her a 2-week follow-up appointment.   #Anemia Hemoglobin was 11.8 and stable on postop day 1.  This did not change from admission.  No evidence of bleeding from surgery site or having other bleeding symptoms.  There were no acute concerns during this hospitalization.    #Hypertension Her blood pressures were elevated up to 152/81 during this admission.  We held her home amlodipine  5 mg before her procedure.  Her BP was likely elevated due to acute pain/stress from ankle fracture.  This was resumed upon discharge.    # Stress incontinence In the setting of acute fall in the elderly we held her oxybutynin.  Please reassess the benefits versus risks upon continuing this medication.  We continued this upon discharge.   #Meningioma  A 1.5 cm extra-axial mass over the anterior left frontal lobe, likely a meningioma, was identified on a CT head scan in June 2025. Follow-up MRI of the head demonstrated stability of the  lesion. The patient has been evaluated by neurosurgery, who recommended repeating the MRI in 6 to 12 months. Given the stability of the mass and the patient's current lack of headaches or neurological symptoms, I have a low suspicion that this lesion contributed to her fall  #GERD No acute concerns, we continued her omeprazole  after her surgery.   # Hyperlipidemia No acute concerns, we continued her statin after surgery.   Discharge Subjective: Ms.Zyiah B Reagor reports that she is not in pain.  She notes that her falls are likely due to just slipping on her carpet.  Did not endorse any other symptoms before falling.  She has been taking phentermine and topiramate for a long duration of time.  Patient is medically ready for discharge.  Discharge Exam:   BP (!) 143/61   Pulse 69   Temp 97.8 F (36.6 C) (Oral)   Resp 17   Ht 5' 2.5 (1.588 m)   Wt 72.5 kg   SpO2 98%   BMI 28.77 kg/m  Physical Exam: Constitutional: well-appearing, well-nourished, in no acute distress HENT: normocephalic atraumatic, mucous membranes moist Eyes: conjunctiva non-erythematous, PERRL, no scleral icterus Neck: supple without lesions, thyroid non-enlarged and non-tender Cardiovascular: regular rate and rhythm, no m/r/g Pulmonary/Chest: normal work of breathing on room air, lungs clear to auscultation bilaterally Abdominal: soft, non-tender, non-distended, bowel sounds normal MSK: normal bulk and tone; padded splint noted to LLE Neurological: alert & oriented x3, moving all extremities equally Skin: warm and dry Extremities: no edema or cyanosis; peripheral pulses intact Psych: normal mood and affect, thought content normal   Pertinent Labs, Studies, and Procedures:     Latest Ref Rng & Units 11/29/2023    5:14 AM 11/28/2023    7:39 AM 05/30/2019    7:29 PM  CBC  WBC 4.0 - 10.5 K/uL 6.9  6.8  4.0   Hemoglobin 12.0 - 15.0 g/dL 88.1  88.1  86.0   Hematocrit 36.0 - 46.0 % 34.4  34.5  41.1   Platelets  150 - 400 K/uL 193  180  193        Latest Ref Rng & Units 11/29/2023    5:14 AM 11/28/2023    7:39 AM 05/30/2019    7:29 PM  CMP  Glucose 70 - 99 mg/dL 871  873  895   BUN 8 - 23 mg/dL 13  19  14    Creatinine 0.44 - 1.00 mg/dL 9.19  9.22  9.34   Sodium 135 - 145 mmol/L 139  141  138   Potassium 3.5 - 5.1 mmol/L 4.0  3.6  3.7   Chloride 98 - 111 mmol/L 110  108  101   CO2 22 - 32 mmol/L 22  21  29    Calcium 8.9 - 10.3 mg/dL 8.5  8.6  9.1   Total Protein 6.5 - 8.1 g/dL   7.2   Total Bilirubin 0.3 - 1.2 mg/dL   0.9   Alkaline Phos 38 - 126 U/L   61   AST 15 - 41 U/L   48   ALT 0 - 44 U/L   57     DG Ankle Complete Left Result Date: 11/28/2023 CLINICAL DATA:  Postop fracture fixation. EXAM: LEFT ANKLE COMPLETE - 3+ VIEW COMPARISON:  Same day. FINDINGS: 2 screws traverse a medial malleolar fracture. Lateral plate and screw fixation of a distal fibular fracture. Fracture fragments are in near anatomic alignment. Ankle mortise is intact. Plaster splint in place. IMPRESSION: ORIF of bilateral malleolar fractures. Electronically Signed   By: Newell Eke M.D.   On: 11/28/2023 18:06   DG Ankle Complete Left Result Date: 11/28/2023 CLINICAL DATA:  ORIF ankle fracture. EXAM: LEFT ANKLE COMPLETE - 3+ VIEW COMPARISON:  11/28/2023 at 1211 hours. FINDINGS: Five intraoperative fluoroscopic spot views of the left ankle in are provided in the frontal and lateral projections. Two screws traverse a medial malleolus fracture. Lateral plate and screw fixation of a distal fibular metaphyseal fracture. Fracture fragments are in better anatomic alignment. A overall osseous detail is degraded by technique. IMPRESSION: Interval ORIF of bimalleolar fractures with better anatomic alignment. Electronically Signed   By: Newell Eke M.D.   On: 11/28/2023 18:00   DG C-Arm 1-60 Min-No Report Result Date: 11/28/2023 Fluoroscopy was utilized by the requesting physician.  No radiographic interpretation.   DG Ankle Left  Port Result Date: 11/28/2023 CLINICAL DATA:  Post reduction left ankle fracture. EXAM: PORTABLE LEFT ANKLE - 2 VIEW COMPARISON:  Same day at 0806 hours. FINDINGS: Interval reduction of previously seen bimalleolar ankle fracture dislocation. Ankle mortise is in near anatomic alignment with probable slight widening of the talofibular joint. Minimally displaced fractures of the medial and lateral malleoli, as before. Fiberglass splint in place. IMPRESSION: Interval reduction of a bimalleolar ankle fracture dislocation with near complete restoration of anatomic alignment. Electronically Signed   By: Newell Eke M.D.   On: 11/28/2023 12:41   DG Ankle Complete Left Result Date: 11/28/2023 CLINICAL DATA:  Fall, deep laceration to medial ankle. EXAM: LEFT ANKLE COMPLETE - 3+ VIEW COMPARISON:  None Available. FINDINGS: There are acute mild-to-moderately displaced fractures of the medial and lateral malleoli with associated disruption of the ankle mortise. There is also probable avulsed fracture fragment adjacent to the calcaneocuboid joint seen on the lateral film which may represent additional fracture. No other acute fracture or dislocation. No aggressive osseous lesion. Calcaneal spur noted along the Achilles tendon attachment site. No focal soft tissue swelling. No radiopaque foreign bodies. IMPRESSION: *Acute fractures of the medial and lateral malleoli with associated disruption of the ankle mortise. Probable avulsed fracture fragment adjacent to the calcaneocuboid joint. Electronically Signed   By: Ree Molt M.D.   On: 11/28/2023 08:21     Discharge Instructions:  Discharge Instructions        Franky Light, MD Lauraine Moores PA-C [facility] [address] [phone] (tel)   [phone] (fax)                                  POST-OPERATIVE INSTRUCTIONS   WEIGHT BEARING STATUS: Non-weightbearing left lower extremity   RANGE OF MOTION/ACTIVITY:  Ok for knee  motion as tolerated. Keep splint in place   WOUND CARE Please keep splint clean dry and intact until follow-up. If your splint gets wet for any reason please contact the office immediately.  Do not stick anything down your splint such as pencils, momey, hangers to try and scratch yourself.  If you feel itchy take Benadryl  as prescribed on the bottle for itching You may shower on Post-Op Day #2.  You must keep splint dry during this process and may find that a plastic bag taped around the extremity or alternatively a towel based bath may be a better option.   If you get your splint wet or if it is damaged please contact our clinic.  EXERCISES Due to your splint being in place you will not be able to bear weight through your extremity.   DO NOT PUT ANY WEIGHT ON YOUR OPERATIVE LEG Please use crutches or a walker to avoid weight bearing.   DVT/PE prophylaxis: Aspirin   DIET: As you were eating previously.  Can use over the counter stool softeners and bowel preparations, such as Miralax , to help with bowel movements.  Narcotics can be constipating.  Be sure to drink plenty of fluids  REGIONAL ANESTHESIA (NERVE BLOCKS) The anesthesia team may have performed a nerve block for you if safe in the setting of your care.  This is a great tool used to minimize pain.  Typically the block may start wearing off overnight but the long acting medicine may last for 3-4 days.  The nerve block wearing off can be a challenging period but please utilize your as needed pain medications to try and manage this period.    POST-OP MEDICATIONS- Multimodal approach to pain control  In general your pain will be controlled with a combination of substances.  Prescriptions unless otherwise discussed are electronically sent to your pharmacy.  This is a carefully made plan we use to minimize narcotic use.     - Meloxicam OR Celebrex - Anti-inflammatory medication taken on a scheduled basis  - Acetaminophen  - Non-narcotic pain  medicine taken on a scheduled basis   - Oxycodone  - This is a strong narcotic, to be used only on an as needed basis for pain.  -  Aspirin  325mg  - This medicine is used to minimize the risk of blood clots after surgery.             -          Zofran  - take as needed for nausea   FOLLOW-UP If you develop a Fever (>101.5), Redness or Drainage from the surgical incision site, please call our office to arrange for an evaluation. Please call the office to schedule a follow-up appointment for your incision check if you do not already have one, 7-10 days post-operatively.   VISIT OUR WEBSITE FOR ADDITIONAL INFORMATION: [email or web address]   HELPFUL INFORMATION You should wean off your narcotic medicines as soon as you are able.  Most patients will be off or using minimal narcotics before their first postop appointment.  We suggest you use the pain medication the first night prior to going to bed, in order to ease any pain when the anesthesia wears off. You should avoid taking pain medications on an empty stomach as it will make you nauseous.  Do not drink alcoholic beverages or take illicit drugs when taking pain medications.  In most states it is against the law to drive while you are in a splint or sling.  And certainly against the law to drive while taking narcotics.  You may return to work/school in the next couple of days when you feel up to it.   Pain medication may make you constipated.  Below are a few solutions to try in this order: Decrease the amount of pain medication if you aren't having pain. Drink lots of decaffeinated fluids. Drink prune juice and/or each dried prunes  If the first 3 don't work start with additional solutions Take Colace - an over-the-counter stool softener Take Senokot - an over-the-counter laxative Take Miralax  - a stronger over-the-counter laxative  -----------------------------------------------------  To Richerd KATHEE Munroe or their caretakers,  You  were recently admitted to [facility] for an open left ankle fracture.  Your condition was treated with surgery.  You received antibiotics to prevent infection during your stay.  We have prescribed you muscle relaxers and pain medications that you can take while in the acute healing process.  We have also ordered the necessary durable medical equipment that you will need at home.  Please make sure to not bear weight on your left leg for 2 weeks until you see your orthopedic surgeon for a follow-up visit.  Continue taking your home medications with the following changes:  Start taking Aspirin  EC 325 mg daily Bisacodyl  (Dulcolax) 5 mg EC tablet daily as needed for moderate constipation Docusate sodium  (Colace) 100 mg capsule - take 1 capsule twice daily for constipation Methocarbamol  (Robaxin ) 500 mg every 6 hours as needed for muscle spasms Oxycodone  (Oxy IR/Roxicodone ) 5 mg immediate release tablet - take 0.5 every 4 hours as needed for pain Stop taking  Continue taking Acetaminophen  (Tylenol ) 500 mg and take 2 tablets as needed for moderate pain Amlodipine  (Norvasc ) 5 mg daily Vitamin B capsule Calcium p.o. D-mannose p.o. Famotidine (Pepcid) 40 mg at bedtime Magnesium  p.o. at bedtime Melatonin p.o. HyperTET Omeprazole  (Prilosec) 40 mg in the morning and at bedtime Oxybutynin (Ditropan) 5 mg in the morning and at bedtime Phentermine (Adipex-P) 37.5 mg daily Simvastatin  (Zocor ) 20 mg at bedtime Topiramate (Topamax) 50 mg at bedtime Cyanocobalamin (vitamin B-12 p.o.) daily Sorbic acid (vitamin C p.o.) and morning and at bedtime  You should seek further medical care if you experience worsening pain, swelling, or bleeding of your surgical site.  Also seek medical care if you experience unilateral leg swelling or shortness of breath.  Please follow up with the following doctors/specialties: - Orthopedic surgery in 2 weeks for postop visit  We recommend that you also see your  primary care doctor in about a week to make sure that you continue to improve. We are so glad that you are feeling better.  Sincerely,  [facility]      Signed:  Letha Cheadle, MD Internal Medicine Resident, PGY-1 11/29/2023, 2:23 PM Please contact the on call pager after 5 pm and on weekends at [phone].

## 2023-11-30 ENCOUNTER — Encounter (HOSPITAL_COMMUNITY): Payer: Self-pay | Admitting: Student

## 2023-11-30 LAB — CBC
HCT: 32.5 % — ABNORMAL LOW (ref 36.0–46.0)
Hemoglobin: 11.1 g/dL — ABNORMAL LOW (ref 12.0–15.0)
MCH: 29.4 pg (ref 26.0–34.0)
MCHC: 34.2 g/dL (ref 30.0–36.0)
MCV: 86.2 fL (ref 80.0–100.0)
Platelets: 183 K/uL (ref 150–400)
RBC: 3.77 MIL/uL — ABNORMAL LOW (ref 3.87–5.11)
RDW: 13 % (ref 11.5–15.5)
WBC: 5.9 K/uL (ref 4.0–10.5)
nRBC: 0 % (ref 0.0–0.2)

## 2023-11-30 NOTE — Progress Notes (Signed)
 Orthopaedic Trauma Progress Note  SUBJECTIVE: Doing fairly well this morning.  Had been increased pain overnight due to lots of activity yesterday.  Feels that therapies went well.  Was able to practice mobilizing with a knee scooter.  Liked this option for mobility.  Feels sleepy currently, just waking up.  Has not eaten breakfast yet.  Plan is for discharge home later today once DME arrives.  Her husband is on the way to the hospital now.  No family at bedside currently   OBJECTIVE:  Vitals:   11/30/23 0741 11/30/23 0742  BP: (!) 163/62 (!) 150/59  Pulse: 69 68  Resp:    Temp: 98 F (36.7 C)   SpO2: 99% 100%    Opiates Today (MME): Today's  total administered Morphine Milligram Equivalents: 0 Opiates Yesterday (MME): Yesterday's total administered Morphine Milligram Equivalents: 22.5  General: Resting comfortably in bed, no acute distress. Respiratory: No increased work of breathing.  Operative Extremity (left lower extremity): Well-padded, well-fitting splint in place.  Nontender above splint.  Endorses sensation to light touch over the toes as well as the dorsal and plantar aspect of the forefoot.  The remainder of motor and sensory exam to the foot and ankle limited secondary to splint placement.  Toes are warm and well-perfused.  IMAGING: Stable post op imaging left ankle  LABS:  Results for orders placed or performed during the hospital encounter of 11/28/23 (from the past 24 hours)  CBC     Status: Abnormal   Collection Time: 11/30/23  4:03 AM  Result Value Ref Range   WBC 5.9 4.0 - 10.5 K/uL   RBC 3.77 (L) 3.87 - 5.11 MIL/uL   Hemoglobin 11.1 (L) 12.0 - 15.0 g/dL   HCT 67.4 (L) 63.9 - 53.9 %   MCV 86.2 80.0 - 100.0 fL   MCH 29.4 26.0 - 34.0 pg   MCHC 34.2 30.0 - 36.0 g/dL   RDW 86.9 88.4 - 84.4 %   Platelets 183 150 - 400 K/uL   nRBC 0.0 0.0 - 0.2 %    ASSESSMENT: Gina Arnold is a 78 y.o. female, 2 Days Post-Op s/p fall downstairs Procedures: ORIF LEFT OPEN  ANKLE FRACTURE  CV/Blood loss: Hemoglobin 11.1 this morning, stable over the last 48 hours.  Hemodynamically stable  PLAN: Weightbearing: NWB LLE ROM: Okay for knee motion.  Maintain splint Incisional and dressing care: Dressings left intact until follow-up  Showering: Keep splint covered and dry Orthopedic device(s): Splint LLE Pain management:  1. Tylenol  1000 mg q 6 hours scheduled 2. Robaxin  500 mg q 6 hours PRN 3. Oxycodone  2.5-5 mg q 4 hours PRN 4. Dilaudid  0.5 mg q 4 hours PRN VTE prophylaxis: Lovenox , SCDs ID:  Ancef  2gm post op per open fracture protocol completed Foley/Lines:  No foley, KVO IVFs Impediments to Fracture Healing: Open fracture.  Vitamin D  level 67, no additional supplementation needed Dispo: PT/OT evaluation ongoing.  Discharge today per primary team.  D/C recommendations: - Oxycodone  2.5 mg, Tylenol , Robaxin  for pain control - Aspirin  325 mg daily x 30 days for DVT prophylaxis - No additional need for Vit D supplementation  Follow - up plan: 2 weeks after d/c for wound check, splint/suture removal, and repeat x-rays   Contact information:  Franky Light MD, Lauraine Moores PA-C. After hours and holidays please check [email or web address] for group call information for [facility]   Lauraine PATRIC Moores, PA-C [phone] (office) [email or web address]

## 2023-11-30 NOTE — Progress Notes (Signed)
 Occupational Therapy Treatment Patient Details Name: Gina Arnold MRN: [id number] DOB: Nov 29, 1945 Today's Date: 11/30/2023   History of present illness Pt is a 78 y/o female who presents 11/28/2023 s/p fall sustaining a L bimalleolar ankle fx. She is now s/p ORIF and is NWB on the LLE. PMH significant for HTN, stress incontinence.   OT comments  Pt progressing toward established OT goals and preparing to dc on arrival. Seen for education regarding tub transfer and safety with family facilitation of stairs using wheelchair to bump her up stairs. Educated regarding set-up, safety, positioning, for showers and stairs with pt with good support and all needed equipment. Pt with no further questions.       If plan is discharge home, recommend the following:  A little help with walking and/or transfers;A little help with bathing/dressing/bathroom;Assistance with cooking/housework;Assist for transportation;Help with stairs or ramp for entrance   Equipment Recommendations  None recommended by OT    Recommendations for Other Services      Precautions / Restrictions Precautions Precautions: Fall Recall of Precautions/Restrictions: Intact Restrictions Weight Bearing Restrictions Per Provider Order: Yes LLE Weight Bearing Per Provider Order: Non weight bearing       Mobility Bed Mobility Overal bed mobility: Modified Independent                  Transfers Overall transfer level: Needs assistance Equipment used: Rolling walker (2 wheels) Transfers: Sit to/from Stand, Bed to chair/wheelchair/BSC Sit to Stand: Supervision                 Balance                                           ADL either performed or assessed with clinical judgement   ADL Overall ADL's : Needs assistance/impaired                                 Tub/ Shower Transfer: Ambulation;Contact guard assist   Functional mobility during ADLs: Contact guard  assist;Rolling walker (2 wheels) General ADL Comments: also reviewed techniques for bADL from seated position as needed and safety with son bumping pt up stairs into home    Extremity/Trunk Assessment Upper Extremity Assessment Upper Extremity Assessment: Overall WFL for tasks assessed   Lower Extremity Assessment Lower Extremity Assessment: Defer to PT evaluation        Vision       Perception     Praxis     Communication Communication Communication: No apparent difficulties   Cognition Arousal: Alert Behavior During Therapy: WFL for tasks assessed/performed Cognition: No apparent impairments                               Following commands: Intact        Cueing   Cueing Techniques: Verbal cues, Gestural cues  Exercises      Shoulder Instructions       General Comments      Pertinent Vitals/ Pain       Pain Assessment Pain Assessment: Faces Faces Pain Scale: Hurts a little bit Pain Location: L ankle Pain Descriptors / Indicators: Aching Pain Intervention(s): Limited activity within patient's tolerance  Home Living  Prior Functioning/Environment              Frequency  Min 2X/week        Progress Toward Goals  OT Goals(current goals can now be found in the care plan section)  Progress towards OT goals: Progressing toward goals  Acute Rehab OT Goals OT Goal Formulation: With patient Time For Goal Achievement: 12/13/23 Potential to Achieve Goals: Good ADL Goals Pt Will Perform Lower Body Dressing: with modified independence;sit to/from stand Pt Will Transfer to Toilet: with modified independence;ambulating Pt Will Perform Tub/Shower Transfer: Tub transfer;with supervision  Plan      Co-evaluation                 AM-PAC OT 6 Clicks Daily Activity     Outcome Measure   Help from another person eating meals?: None Help from another person taking care of  personal grooming?: A Little Help from another person toileting, which includes using toliet, bedpan, or urinal?: A Little Help from another person bathing (including washing, rinsing, drying)?: A Little Help from another person to put on and taking off regular upper body clothing?: A Little Help from another person to put on and taking off regular lower body clothing?: A Little 6 Click Score: 19    End of Session Equipment Utilized During Treatment: Gait belt;Rolling walker (2 wheels);Other (comment)  OT Visit Diagnosis: Unsteadiness on feet (R26.81);Muscle weakness (generalized) (M62.81);Pain Pain - Right/Left: Left Pain - part of body: Ankle and joints of foot   Activity Tolerance Patient tolerated treatment well   Patient Left in bed (EOB awaiting dc)   Nurse Communication Mobility status        Time: 8861-8841 OT Time Calculation (min): 20 min  Charges: OT General Charges $OT Visit: 1 Visit OT Treatments $Self Care/Home Management : 8-22 mins  Elma JONETTA Lebron FREDERICK, OTR/L [facility] Acute Rehabilitation Office: [phone]   Elma JONETTA Lebron 11/30/2023, 2:00 PM

## 2023-11-30 NOTE — TOC Transition Note (Signed)
 Transition of Care [facility]) - Discharge Note   Patient Details  Name: Gina Arnold MRN: [id number] Date of Birth: 1945/08/20  Transition of Care [facility]) CM/SW Contact:  Rosalva Jon Bloch, RN Phone Number: 11/30/2023, 12:13 PM   Clinical Narrative:    Patient will DC to: home Family notified: yes Transport by: car      Ankle fracture, left  Per MD patient ready for DC. RN, patient, patient's husband notified of DC.  Pt agreeable to home health services. Pt without provider preference. Referral made with Amy/ [facility] and accepted. DME will be delivered to bedside prior to d/c. Pt without RX med concerns. Post hospital f/u noted on AVS. RNCM will sign off for now as intervention is no longer needed. Please consult us  again if new needs arise.   Final next level of care: Home w Home Health Services Barriers to Discharge: No Barriers Identified   Patient Goals and CMS Choice     Choice offered to / list presented to : Patient      Discharge Placement                       Discharge Plan and Services Additional resources added to the After Visit Summary for                  DME Arranged: Bedside commode, Walker rolling DME Agency: [employer] Date DME Agency Contacted: 11/29/23 Time DME Agency Contacted: 1530 Representative spoke with at DME Agency: Darlyn HH Arranged: PT, OT HH Agency: [facility] Date [facility] Agency Contacted: 11/29/23 Time HH Agency Contacted: 1530    Social Drivers of Health (SDOH) Interventions SDOH Screenings   Food Insecurity: No Food Insecurity (11/28/2023)  Housing: Low Risk  (11/28/2023)  Transportation Needs: No Transportation Needs (11/28/2023)  Utilities: Not At Risk (11/28/2023)  Financial Resource Strain: Low Risk  (05/02/2023)   Received from [facility]  Social Connections: Socially Integrated (11/28/2023)  Tobacco Use: Medium Risk (11/28/2023)     Readmission Risk Interventions     No data to display

## 2023-11-30 NOTE — Progress Notes (Signed)
 AVS reviewed.  Meds returned to patient.  DME delivered.  Family in the room.

## 2023-11-30 NOTE — Progress Notes (Signed)
 HD#2 SUBJECTIVE:  Patient Summary: Gina Arnold is a 78 y.o. with a pertinent PMH of HTN, recurrent falls, meningioma who presented with Ankle pain s/p fall and admitted for left ankle fracture.   Overnight Events: NAEON.  Interim History: [name] reports that she is not in pain.  She notes that her falls are likely due to just slipping on her carpet.  Did not endorse any other symptoms before falling.  She has been taking phentermine and topiramate for a long duration of time.  Patient is medically ready for discharge.  OBJECTIVE:  Vital Signs: Vitals:   11/29/23 2027 11/30/23 0340 11/30/23 0741 11/30/23 0742  BP: (!) 145/68 (!) 150/78 (!) 163/62 (!) 150/59  Pulse: 78 81 69 68  Resp: 17 18    Temp: 98.4 F (36.9 C) 98.2 F (36.8 C) 98 F (36.7 C)   TempSrc: Oral Oral    SpO2: 95% 100% 99% 100%  Weight:      Height:        Filed Weights   11/28/23 0729 11/28/23 1231  Weight: 72.6 kg 72.5 kg     Intake/Output Summary (Last 24 hours) at 11/30/2023 1127 Last data filed at 11/29/2023 1200 Gross per 24 hour  Intake 240 ml  Output 0 ml  Net 240 ml   Net IO Since Admission: 1,041.22 mL [11/30/23 1127]  Physical Exam: Constitutional: well-appearing, well-nourished, sitting in recliner chair by bed with leg elevated; in no acute distress HENT: normocephalic atraumatic, mucous membranes moist Eyes: conjunctiva non-erythematous, PERRL, no scleral icterus Neck: supple without lesions, thyroid non-enlarged and non-tender Cardiovascular: regular rate and rhythm, no m/r/g Pulmonary/Chest: normal work of breathing on room air, lungs clear to auscultation bilaterally Abdominal: soft, non-tender, non-distended, bowel sounds normal MSK: normal bulk and tone; padded splint noted to LLE Neurological: alert & oriented x3, moving all extremities equally Skin: warm and dry Extremities: no edema or cyanosis; peripheral pulses intact Psych: normal mood and affect, thought  content normal  Patient Lines/Drains/Airways Status     Active Line/Drains/Airways     Name Placement date Placement time Site Days   Peripheral IV 11/28/23 22 G Left;Posterior Wrist 11/28/23  --  Wrist  2   Wound 11/28/23 1443 Surgical Closed Surgical Incision Ankle Left 11/28/23  1443  Ankle  2            Pertinent labs and imaging:      Latest Ref Rng & Units 11/30/2023    4:03 AM 11/29/2023    5:14 AM 11/28/2023    7:39 AM  CBC  WBC 4.0 - 10.5 K/uL 5.9  6.9  6.8   Hemoglobin 12.0 - 15.0 g/dL 88.8  88.1  88.1   Hematocrit 36.0 - 46.0 % 32.5  34.4  34.5   Platelets 150 - 400 K/uL 183  193  180        Latest Ref Rng & Units 11/29/2023    5:14 AM 11/28/2023    7:39 AM 05/30/2019    7:29 PM  CMP  Glucose 70 - 99 mg/dL 871  873  895   BUN 8 - 23 mg/dL 13  19  14    Creatinine 0.44 - 1.00 mg/dL 9.19  9.22  9.34   Sodium 135 - 145 mmol/L 139  141  138   Potassium 3.5 - 5.1 mmol/L 4.0  3.6  3.7   Chloride 98 - 111 mmol/L 110  108  101   CO2 22 - 32 mmol/L  22  21  29    Calcium 8.9 - 10.3 mg/dL 8.5  8.6  9.1   Total Protein 6.5 - 8.1 g/dL   7.2   Total Bilirubin 0.3 - 1.2 mg/dL   0.9   Alkaline Phos 38 - 126 U/L   61   AST 15 - 41 U/L   48   ALT 0 - 44 U/L   57     No results found.  ASSESSMENT/PLAN:  Assessment: Principal Problem:   Ankle fracture, left Active Problems:   Anemia   Gastroesophageal reflux disease with esophagitis   HTN (hypertension)   Hyperlipidemia   Plan: #Acute left ankle fracture #Mechanical Fall [name] presented following a fall, with imaging revealing an acute fracture of both the medial and lateral malleoli, along with distraction of the ankle mortise.  Today is POD1.  Pain is well-controlled without IV medications.  Although both falls appear to be mechanical, I remain concerned about underlying factors such as balance and gait abnormalities, muscle weakness, medication effects, and physical deconditioning, especially given her history of  vitamin D  deficiency. Environmental factors, including poor lighting or cluttered floors at home, may also contribute. The absence of dizziness, confusion, weakness, or systemic signs of infection is reassuring, making cardiovascular, neurological, or infectious causes less likely. Her hemoglobin is stable at 11.8 making anemia less likely. Fall may have been in the setting of phentermine and topiramate use but there are no symptoms to suggest this.  Orthopedics was consulted and completed surgical fixation yesterday.  Will follow their recommendations. -Ortho consulted, appreciate recs:  - POD1 s/p ORIF  - Will need 2-week follow-up appointment  - P.o. oxycodone  2.5-5 mg as needed for pain control  - Needs 3 total doses of Ancef   - Needs ASA 81mg  for 1 mo for DVT ppx - PT/OT post op  - Fall precautions - Consider counseling her to switch to GLP1 for weight loss instead of Topiramate and phentermine   #Anemia Hemoglobin was 11.8 on presentation and stable today on postop day 1. No evidence of bleeding from surgery site or having other bleeding symptoms.  - Continue to monitor  #Hypertension Her blood pressures were elevated up to 152/81 during this admission.  We will hold her home amlodipine  5 mg before her procedure.  BP is likely elevated due to acute pain/stress from ankle fracture.    #Stress incontinence In the setting of acute fall in the elderly we are holding her oxybutynin.  If truly stress incontinent, oxybutynin is not an indication.   #Meningioma  A 1.5 cm extra-axial mass over the anterior left frontal lobe, likely a meningioma, was identified on a CT head scan in June 2025. Follow-up MRI of the head demonstrated stability of the lesion. The patient has been evaluated by neurosurgery, who recommended repeating the MRI in 6 to 12 months. Given the stability of the mass and the patient's current lack of headaches or neurological symptoms, I have a low suspicion that this lesion  contributed to her fall.  #GERD -continue omeprazole   #Hyperlipidemia -continue simvastatin   Best Practice: Diet: Regular IVF: Fluids: none VTE: enoxaparin  (LOVENOX ) injection 40 mg Start: 11/29/23 1000 SCDs Start: 11/28/23 1622 Code: Full  Disposition planning: Therapy Recs: None, DME: bedside commode and scooter DISPO: Anticipated discharge tomorrow to Home pending DME delivery.  Signature:  Letha Cheadle, MD [facility]  PGY-1 11/29/2023, 11:27 AM  On Call pager [phone]

## 2023-12-23 ENCOUNTER — Ambulatory Visit: Payer: Self-pay | Admitting: Urology

## 2024-03-26 ENCOUNTER — Encounter: Payer: Self-pay | Admitting: Urology

## 2024-03-26 ENCOUNTER — Ambulatory Visit: Admitting: Urology

## 2024-03-26 VITALS — BP 160/82 | HR 99 | Ht 62.5 in | Wt 160.0 lb

## 2024-03-26 DIAGNOSIS — N390 Urinary tract infection, site not specified: Secondary | ICD-10-CM | POA: Diagnosis not present

## 2024-03-26 NOTE — Progress Notes (Signed)
 "  03/26/2024 10:10 AM   Gina Arnold 08/28/1945 [id number]  Referring provider: Auston Reyes BIRCH, MD [address] [facility] [place],  [place] [address]  Chief Complaint  Patient presents with   Recurrent UTI   Urologic history: 1. Recurrent UTI Initially seen December 2023 and was placed on a 3 month course of low-dose antibiotic prophylaxis, Estrace , and D-mannose.  3 month follow-up, low-dose antibiotic prophylaxis was discontinued.    HPI: Gina Arnold is a 79 y.o. female presents for a follow-up visit.  Since her last visit she saw Sam Vaillancourt 01/21/2023 with a 1 day history of dysuria, frequency and urgency.  UA showed significant pyuria.  She was started on cefuroxime .  Urine culture grew mixed flora at >100,000 colonies She actually did not remember this visit and has been doing well Has been asymptomatic since that time without recurrent UTI symptoms Remains on D-mannose and low-dose vaginal estrogen   PMH: Past Medical History:  Diagnosis Date   GERD (gastroesophageal reflux disease)    Hypertension     Surgical History: Past Surgical History:  Procedure Laterality Date   ABDOMINAL HYSTERECTOMY     APPENDECTOMY     CHOLECYSTECTOMY     ESOPHAGOGASTRODUODENOSCOPY (EGD) WITH PROPOFOL  N/A 09/01/2022   Procedure: ESOPHAGOGASTRODUODENOSCOPY (EGD) WITH PROPOFOL ;  Surgeon: Therisa Bi, MD;  Location: [facility] ENDOSCOPY;  Service: Gastroenterology;  Laterality: N/A;   EYE SURGERY     FLEXIBLE SIGMOIDOSCOPY N/A 09/01/2022   Procedure: FLEXIBLE SIGMOIDOSCOPY;  Surgeon: Therisa Bi, MD;  Location: [facility] ENDOSCOPY;  Service: Gastroenterology;  Laterality: N/A;   ORIF ANKLE FRACTURE Left 11/28/2023   Procedure: OPEN REDUCTION INTERNAL FIXATION (ORIF) ANKLE FRACTURE;  Surgeon: Kendal Franky SQUIBB, MD;  Location: [facility] OR;  Service: Orthopedics;  Laterality: Left;  ORIF   TONSILLECTOMY      Home Medications:  Allergies as of 03/26/2024       Reactions    Azulfidine [sulfasalazine] Nausea And Vomiting, Other (See Comments)   Leukopenia    Cipro [ciprofloxacin Hcl] Nausea And Vomiting, Other (See Comments)   Fatigue Indigestion    Lortab [hydrocodone-acetaminophen ] Itching   Codeine Itching   Flagyl [metronidazole] Nausea And Vomiting, Other (See Comments)   Fatigue  Indigestion    Sulfa Antibiotics Other (See Comments)   Leukopenia    Ultram [tramadol] Nausea And Vomiting, Other (See Comments)   GI intolerance, pain        Medication List        Accurate as of March 26, 2024 10:10 AM. If you have any questions, ask your nurse or doctor.          acetaminophen  500 MG tablet Commonly known as: TYLENOL  Take 1,000 mg by mouth 2 (two) times daily as needed for moderate pain (pain score 4-6) or headache.   amLODipine  5 MG tablet Commonly known as: NORVASC  Take 1 tablet by mouth daily.   b complex vitamins capsule Take 1 capsule by mouth daily.   bisacodyl  5 MG EC tablet Generic drug: bisacodyl  Take 1 tablet (5 mg total) by mouth daily as needed for moderate constipation.   CALCIUM PO Take 1 tablet by mouth at bedtime.   D-MANNOSE PO Take 1 tablet by mouth daily.   docusate sodium  100 MG capsule Commonly known as: COLACE Take 1 capsule (100 mg total) by mouth 2 (two) times daily.   famotidine 40 MG tablet Commonly known as: PEPCID Take 40 mg by mouth at bedtime.   MAGNESIUM  PO Take 1  tablet by mouth at bedtime.   MELATONIN PO Take 1 tablet by mouth at bedtime.   methocarbamol  500 MG tablet Commonly known as: ROBAXIN  Take 1 tablet (500 mg total) by mouth every 6 (six) hours as needed for muscle spasms.   omeprazole  40 MG capsule Commonly known as: PRILOSEC Take 40 mg by mouth in the morning and at bedtime.   oxybutynin 5 MG tablet Commonly known as: DITROPAN Take 5 mg by mouth in the morning and at bedtime.   phentermine 37.5 MG tablet Commonly known as: ADIPEX-P Take 37.5 mg by mouth daily.    simvastatin  20 MG tablet Commonly known as: ZOCOR  Take 1 tablet by mouth at bedtime.   topiramate 50 MG tablet Commonly known as: TOPAMAX Take 50 mg by mouth at bedtime.   VITAMIN B-12 PO Take 1 tablet by mouth daily.   VITAMIN C PO Take 1 tablet by mouth in the morning and at bedtime.        Allergies: Allergies[1]  Family History: No family history on file.  Social History:  reports that she quit smoking about 53 years ago. Her smoking use included cigarettes. She has never used smokeless tobacco. She reports current alcohol use. She reports that she does not use drugs.   Physical Exam: There were no vitals taken for this visit.  Constitutional:  Alert, No acute distress. HEENT: [place] AT Respiratory: Normal respiratory effort, no increased work of breathing. Psychiatric: Normal mood and affect.   Assessment & Plan:    1. Recurrent UTI  Doing well on low-dose vaginal estrogen and D-mannose Annual follow-up and will call as needed for recurrent UTI symptoms   Glendia JAYSON Barba, MD  [facility] [address] [place], [place] [address] [phone]    [1]  Allergies Allergen Reactions   Azulfidine [Sulfasalazine] Nausea And Vomiting and Other (See Comments)    Leukopenia    Cipro [Ciprofloxacin Hcl] Nausea And Vomiting and Other (See Comments)    Fatigue Indigestion    Lortab [Hydrocodone-Acetaminophen ] Itching   Codeine Itching   Flagyl [Metronidazole] Nausea And Vomiting and Other (See Comments)    Fatigue  Indigestion    Sulfa Antibiotics Other (See Comments)    Leukopenia    Ultram [Tramadol] Nausea And Vomiting and Other (See Comments)    GI intolerance, pain   "

## 2024-04-24 ENCOUNTER — Ambulatory Visit: Admitting: Neurosurgery

## 2024-04-27 ENCOUNTER — Ambulatory Visit: Admitting: Neurosurgery

## 2024-04-27 ENCOUNTER — Encounter: Payer: Self-pay | Admitting: Neurosurgery

## 2024-04-27 VITALS — BP 148/78 | HR 82 | Temp 97.6°F | Ht 62.5 in | Wt 157.4 lb

## 2024-04-27 DIAGNOSIS — D329 Benign neoplasm of meninges, unspecified: Secondary | ICD-10-CM

## 2024-04-27 NOTE — Progress Notes (Unsigned)
 " Assessment : 79 y.o. female PMHx significant for Hypertension, hyperlipidemia, and cataracts in both eye both operated on at [facility].   Starting June 25 the patient reported experiencing a fall and hit her head.   She went to a walk-in clinic and was brought to [facility] and CT (09/10/23) revealed 1.5cm anterior left frontal lobe Mass and that she had a nose fracture. At the time no headaches, changes in vision, or neurologic changes.  She is following ENT for her fracture.   She followed up with neurosurgery at [place] on 11/09/23 who then ordered an MRI to get better imaging and put in a referral to neurosurgery at [facility].   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  I reviewed the MRI with her which shows this left frontal polar mass which seems to be fairly indolent with no surrounding edema in the brain.  I shared with her that at her age of 26, I do not recommend any treatment for this.  More likely than not, it is a tumor that has been there for very long period of time and I would recommend getting an MRI of the brain in 1 year and I will do it without contrast as it is readily visible on the T2 sequences.  I told her that if she starts having any headaches then we can revisit but she is comfortable with getting an image in a year and we will go from there.  I do not believe that this had anything to do with her fall.   Social History   Socioeconomic History   Marital status: Married    Spouse name: Gina Arnold   Number of children: 1   Years of education: Not on file   Highest education level: Not on file  Occupational History   Not on file  Tobacco Use   Smoking status: Former    Current packs/day: 0.00    Types: Cigarettes    Quit date: 1973    Years since quitting: 53.1   Smokeless tobacco: Never   Vaping Use   Vaping status: Never Used  Substance and Sexual Activity   Alcohol use: Yes    Comment: occasionally   Drug use: Never   Sexual activity: Not Currently  Other Topics Concern   Not on file  Social History Narrative   Not on file   Social Drivers of Health   Tobacco Use: Medium Risk (04/27/2024)   Patient History    Smoking Tobacco Use: Former    Smokeless Tobacco Use: Never    Passive Exposure: Not on file  Financial Resource Strain: Low Risk  (05/02/2023)   Received from [facility]   Overall Financial Resource Strain (CARDIA)    Difficulty of Paying Living Expenses: Not hard at all  Food Insecurity: No Food Insecurity (11/28/2023)   Epic    Worried About Radiation Protection Practitioner of Food in the Last Year: Never true    Ran Out of Food in the Last Year: Never true  Transportation Needs: No Transportation Needs (11/28/2023)   Epic    Lack of Transportation (Medical): No    Lack of Transportation (Non-Medical): No  Physical Activity: Not on file  Stress: Not on file  Social Connections: Socially Integrated (11/28/2023)   Social Connection and Isolation Panel  Frequency of Communication with Friends and Family: More than three times a week    Frequency of Social Gatherings with Friends and Family: More than three times a week    Attends Religious Services: More than 4 times per year    Active Member of [employer] or Organizations: Yes    Attends Banker Meetings: More than 4 times per year    Marital Status: Married  Catering Manager Violence: Not At Risk (11/28/2023)   Epic    Fear of Current or Ex-Partner: No    Emotionally Abused: No    Physically Abused: No    Sexually Abused: No  Depression (PHQ2-9): Not on file  Alcohol Screen: Not on file  Housing: Low Risk (11/28/2023)   Epic    Unable to Pay for Housing in the Last Year: No    Number of Times Moved in the Last Year: 0    Homeless in the Last Year: No  Utilities: Not At Risk (11/28/2023)    Epic    Threatened with loss of utilities: No  Health Literacy: Not on file    History reviewed. No pertinent family history.  Allergies[1]  Past Medical History:  Diagnosis Date   GERD (gastroesophageal reflux disease)    Hypertension     Past Surgical History:  Procedure Laterality Date   ABDOMINAL HYSTERECTOMY     APPENDECTOMY     CHOLECYSTECTOMY     ESOPHAGOGASTRODUODENOSCOPY (EGD) WITH PROPOFOL  N/A 09/01/2022   Procedure: ESOPHAGOGASTRODUODENOSCOPY (EGD) WITH PROPOFOL ;  Surgeon: Therisa Bi, MD;  Location: [facility] ENDOSCOPY;  Service: Gastroenterology;  Laterality: N/A;   EYE SURGERY     FLEXIBLE SIGMOIDOSCOPY N/A 09/01/2022   Procedure: FLEXIBLE SIGMOIDOSCOPY;  Surgeon: Therisa Bi, MD;  Location: [facility] ENDOSCOPY;  Service: Gastroenterology;  Laterality: N/A;   ORIF ANKLE FRACTURE Left 11/28/2023   Procedure: OPEN REDUCTION INTERNAL FIXATION (ORIF) ANKLE FRACTURE;  Surgeon: Kendal Franky SQUIBB, MD;  Location: [facility] OR;  Service: Orthopedics;  Laterality: Left;  ORIF   TONSILLECTOMY       Physical Exam   Physical Exam HENT:     Head: Normocephalic.     Nose: Nose normal.  Eyes:     Pupils: Pupils are equal, round, and reactive to light.  Cardiovascular:     Rate and Rhythm: Normal rate.  Pulmonary:     Effort: Pulmonary effort is normal.  Abdominal:     General: Abdomen is flat.  Musculoskeletal:     Cervical back: Normal range of motion.  Neurological:     Mental Status: Patient is alert.     Cranial Nerves: Cranial nerves 2-12 are intact.     Sensory: Sensation is intact.     Motor: Motor function is intact.     Coordination: Coordination is intact.     Results for orders placed or performed during the hospital encounter of 11/16/23  MR BRAIN W WO CONTRAST   Narrative   CLINICAL DATA:  Meningioma  EXAM: MRI HEAD WITHOUT AND WITH CONTRAST  TECHNIQUE: Multiplanar, multiecho pulse sequences of the brain and surrounding structures were obtained without and with  intravenous contrast.  CONTRAST:  7.5 mL of Vueway   COMPARISON:  CT head June 21, 25.  FINDINGS: Brain: Approximately 1.4 x 1.0 x 1.4 cm homogeneously enhancing extra-axial dural-based mass along the left frontal convexity, compatible with a meningioma. The meningioma extends into the adjacent frontal calvarium. Minimal mass effect on the adjacent frontal lobe without brain edema. No midline shift. No evidence of acute  infarct, acute hemorrhage, or hydrocephalus. Moderate T2/FLAIR hyperintensities the white matter compatible with chronic microvascular ischemic change.  Vascular: Normal flow voids.  Skull and upper cervical spine: The above meningioma extends into the frontal calvarium. Otherwise, normal marrow signal.  Sinuses/Orbits: Left frontal and anterior ethmoid air cell mucosal thickening. No acute orbital findings.  Other: No mastoid effusions.  IMPRESSION: 1. Approximately 1.4 cm left frontal convexity meningioma. Slight mass effect on the adjacent frontal lobe without brain edema. 2. No evidence of acute intracranial abnormality. 3. Moderate chronic microvascular ischemic change.   Electronically Signed   By: Gilmore GORMAN Molt M.D.   On: 11/27/2023 00:04   Results for orders placed or performed during the hospital encounter of 09/10/23  CT HEAD WO CONTRAST ( )   Narrative   CLINICAL DATA:  Head trauma, neck trauma. Mechanical fall yesterday.  EXAM: CT HEAD WITHOUT CONTRAST  CT MAXILLOFACIAL WITHOUT CONTRAST  CT CERVICAL SPINE WITHOUT CONTRAST  TECHNIQUE: Multidetector CT imaging of the head, cervical spine, and maxillofacial structures were performed using the standard protocol without intravenous contrast. Multiplanar CT image reconstructions of the cervical spine and maxillofacial structures were also generated.  RADIATION DOSE REDUCTION: This exam was performed according to the departmental dose-optimization program which includes  automated exposure control, adjustment of the mA and/or kV according to patient size and/or use of iterative reconstruction technique.  COMPARISON:  CT head and cervical spine 02/08/2023.  FINDINGS: CT HEAD FINDINGS  Brain: No acute intracranial hemorrhage. No CT evidence of acute infarct. Nonspecific hypoattenuation in the periventricular and subcortical white matter favored to reflect chronic microvascular ischemic changes. Similar appearance of 1.5 cm hyperattenuating extra-axial mass over the anterior left frontal lobe. No edema, mass effect, or midline shift. The basilar cisterns are patent.  Ventricles: The ventricles are normal.  Vascular: No hyperdense vessel or unexpected calcification.  Skull: No acute or aggressive finding.  Other: Mastoid air cells are clear.  CT MAXILLOFACIAL FINDINGS  Osseous: Irregularity of the right nasal bone with slight medial displacement and lateral apex angulation. Additional mild irregularity of the left nasal bone. There is narrowing of the nasal vestibule on the right. Maxillofacial bones are otherwise intact. No mandibular dislocation. No destructive osseous lesion.  Orbits: Globes are intact. Bilateral lens replacement. Extraocular muscles and optic nerve sheath complexes are unremarkable. Normal appearance of the orbital fat.  Sinuses: Mild mucosal thickening in the right maxillary sinus. No air-fluid levels.  Soft tissues: Soft tissue swelling overlying the bilateral nasal bones slightly greater on the right. There is additional facial soft tissue swelling overlying the maxillary sinuses, right greater than left.  CT CERVICAL SPINE FINDINGS  Alignment: Straightening of the cervical lordosis. No significant listhesis. No facet subluxation or dislocation.  Skull base and vertebrae: No acute fracture. No primary bone lesion or focal pathologic process.  Soft tissues and spinal canal: No prevertebral fluid or swelling.  No visible canal hematoma. 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 Subcentimeter thyroid nodules, no follow-up required.  Disc levels: Intervertebral disc space narrowing at multiple levels most pronounced at C3-4 and C4-5. No high-grade osseous spinal canal stenosis. Facet arthrosis and uncovertebral hypertrophy at multiple levels.  Upper chest: Negative.  Other: None.  IMPRESSION: No CT evidence of acute intracranial abnormality.  No acute fracture or traumatic malalignment of the cervical spine.  Mildly displaced and angulated right nasal bone fracture and additional mildly displaced left nasal bone fracture with overlying soft tissue swelling. Bilateral facial soft tissue  swelling.  Similar 1.5 cm extra-axial mass over the anterior left frontal lobe, favor meningioma.  Chronic and degenerative changes as above.   Electronically Signed   By: Donnice Mania M.D.   On: 09/10/2023 12:39        [1]  Allergies Allergen Reactions   Azulfidine [Sulfasalazine] Nausea And Vomiting and Other (See Comments)    Leukopenia    Cipro [Ciprofloxacin Hcl] Nausea And Vomiting and Other (See Comments)    Fatigue Indigestion    Lortab [Hydrocodone-Acetaminophen ] Itching   Codeine Itching   Flagyl [Metronidazole] Nausea And Vomiting and Other (See Comments)    Fatigue  Indigestion    Sulfa Antibiotics Other (See Comments)    Leukopenia    Ultram [Tramadol] Nausea And Vomiting and Other (See Comments)    GI intolerance, pain   "

## 2025-03-26 ENCOUNTER — Ambulatory Visit: Admitting: Urology

## 2025-04-26 ENCOUNTER — Telehealth: Admitting: Neurosurgery
